# Patient Record
Sex: Male | Born: 1965 | Race: White | Hispanic: No | Marital: Married | Smoking: Former smoker
Health system: Southern US, Community
[De-identification: ages and names within clinical notes are randomized; demographics above are authoritative.]

## PROBLEM LIST (undated history)

## (undated) DIAGNOSIS — K644 Residual hemorrhoidal skin tags: Secondary | ICD-10-CM

## (undated) DIAGNOSIS — K219 Gastro-esophageal reflux disease without esophagitis: Secondary | ICD-10-CM

## (undated) DIAGNOSIS — M51369 Other intervertebral disc degeneration, lumbar region without mention of lumbar back pain or lower extremity pain: Secondary | ICD-10-CM

## (undated) DIAGNOSIS — M4800 Spinal stenosis, site unspecified: Secondary | ICD-10-CM

## (undated) DIAGNOSIS — IMO0002 Reserved for concepts with insufficient information to code with codable children: Secondary | ICD-10-CM

## (undated) DIAGNOSIS — E785 Hyperlipidemia, unspecified: Secondary | ICD-10-CM

## (undated) DIAGNOSIS — M5136 Other intervertebral disc degeneration, lumbar region: Secondary | ICD-10-CM

## (undated) HISTORY — DX: Spinal stenosis, site unspecified: M48.00

## (undated) HISTORY — DX: Residual hemorrhoidal skin tags: K64.4

## (undated) HISTORY — DX: Hyperlipidemia, unspecified: E78.5

## (undated) HISTORY — DX: Reserved for concepts with insufficient information to code with codable children: IMO0002

## (undated) HISTORY — DX: Gastro-esophageal reflux disease without esophagitis: K21.9

## (undated) HISTORY — PX: OTHER SURGICAL HISTORY: SHX169

---

## 1992-01-18 HISTORY — PX: CHOLESTEATOMA EXCISION: SHX1345

## 2001-09-20 ENCOUNTER — Inpatient Hospital Stay (HOSPITAL_COMMUNITY): Admission: EM | Admit: 2001-09-20 | Discharge: 2001-09-23 | Payer: Self-pay | Admitting: Psychiatry

## 2001-09-27 ENCOUNTER — Encounter: Admission: RE | Admit: 2001-09-27 | Discharge: 2001-09-27 | Payer: Self-pay | Admitting: *Deleted

## 2001-09-28 ENCOUNTER — Other Ambulatory Visit (HOSPITAL_COMMUNITY): Admission: RE | Admit: 2001-09-28 | Discharge: 2001-10-03 | Payer: Self-pay | Admitting: *Deleted

## 2001-12-24 ENCOUNTER — Encounter: Payer: Self-pay | Admitting: Family Medicine

## 2001-12-24 ENCOUNTER — Ambulatory Visit (HOSPITAL_COMMUNITY): Admission: RE | Admit: 2001-12-24 | Discharge: 2001-12-24 | Payer: Self-pay | Admitting: Family Medicine

## 2002-02-17 LAB — HM COLONOSCOPY

## 2002-03-07 ENCOUNTER — Encounter (INDEPENDENT_AMBULATORY_CARE_PROVIDER_SITE_OTHER): Payer: Self-pay | Admitting: Specialist

## 2002-03-07 ENCOUNTER — Ambulatory Visit (HOSPITAL_COMMUNITY): Admission: RE | Admit: 2002-03-07 | Discharge: 2002-03-07 | Payer: Self-pay | Admitting: Gastroenterology

## 2004-08-31 ENCOUNTER — Encounter: Admission: RE | Admit: 2004-08-31 | Discharge: 2004-08-31 | Payer: Self-pay | Admitting: Neurology

## 2009-01-14 ENCOUNTER — Encounter: Admission: RE | Admit: 2009-01-14 | Discharge: 2009-01-14 | Payer: Self-pay | Admitting: Neurosurgery

## 2009-06-26 ENCOUNTER — Encounter: Admission: RE | Admit: 2009-06-26 | Discharge: 2009-06-26 | Payer: Self-pay | Admitting: Neurosurgery

## 2009-12-01 ENCOUNTER — Encounter: Admission: RE | Admit: 2009-12-01 | Discharge: 2009-12-01 | Payer: Self-pay | Admitting: Neurosurgery

## 2010-02-09 ENCOUNTER — Ambulatory Visit
Admission: RE | Admit: 2010-02-09 | Discharge: 2010-02-09 | Payer: Self-pay | Source: Home / Self Care | Attending: Internal Medicine | Admitting: Internal Medicine

## 2010-02-09 DIAGNOSIS — R05 Cough: Secondary | ICD-10-CM | POA: Insufficient documentation

## 2010-02-09 DIAGNOSIS — E785 Hyperlipidemia, unspecified: Secondary | ICD-10-CM | POA: Insufficient documentation

## 2010-02-09 DIAGNOSIS — I10 Essential (primary) hypertension: Secondary | ICD-10-CM | POA: Insufficient documentation

## 2010-02-09 DIAGNOSIS — J309 Allergic rhinitis, unspecified: Secondary | ICD-10-CM | POA: Insufficient documentation

## 2010-02-09 DIAGNOSIS — R059 Cough, unspecified: Secondary | ICD-10-CM | POA: Insufficient documentation

## 2010-02-09 DIAGNOSIS — Z87891 Personal history of nicotine dependence: Secondary | ICD-10-CM | POA: Insufficient documentation

## 2010-02-09 DIAGNOSIS — F17200 Nicotine dependence, unspecified, uncomplicated: Secondary | ICD-10-CM | POA: Insufficient documentation

## 2010-02-18 NOTE — Assessment & Plan Note (Signed)
Summary: Pulmonary consultation/ refractory acute cough   Copy to:  Dr. Rudi Heap Primary Provider/Referring Provider:  Dr. Rudi Heap  CC:  Consult Cough.  History of Present Illness: 45 yowm active smoker since age 45 with h/o rhinitis back in the 3's by [place] no better after a year of shots so stopped and overall some better since quit.   February 09, 2010  1st pulmonary office eval with acute cough x 2weeks with ok cxr  rx with avelox and prednisone and flonas and nebs (new) >  some better but still severe cough and not able to sleep at all, traces of green sputum and now on augmentin. Pt denies any significant sore throat, dysphagia, itching, sneezing,  fever, chills, sweats, unintended wt loss, pleuritic or exertional cp, hempoptysis, change in activity tolerance  orthopnea pnd or leg swelling.  Pt also denies any obvious fluctuation in symptoms with weather or environmental change or other alleviating or aggravating factors.  feels breathing ok as long as not coughing.     Preventive Screening-Counseling & Management  Alcohol-Tobacco     Smoking Status: current     Smoking Cessation Counseling: yes  Current Medications (verified): 1)  Flonase 50 Mcg/act Susp (Fluticasone Propionate) .... One Spray At Bedtime 2)  Amoxicillin 875 Mg Tabs (Amoxicillin) .... Take 1 Tablet By Mouth Two Times A Day 3)  Ipratropium Bromide 0.02 % Soln (Ipratropium Bromide) .... Twice Day 4)  Albuterol Sulfate (2.5 Mg/42ml) 0.083% Nebu (Albuterol Sulfate) .... Four Times Daily 5)  Lipitor 40 Mg Tabs (Atorvastatin Calcium) .... Take 1 Tablet By Mouth Once A Day 6)  Nexium 20 Mg Cpdr (Esomeprazole Magnesium) .... Take 1 Tablet By Mouth Once A Day 7)  Vicodin 5-500 Mg Tabs (Hydrocodone-Acetaminophen) .... As Needed  Allergies (verified): 1)  ! 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 Laveda Abbe  Family History: MGM-died of MI, diabetes  Maternal uncle-HTN  Social History: Patient is a current smoker.  1 pack per week, started in [address]. Married Works as Media planner in [employer] Smoking Status:  current  Review of Systems       The patient complains of shortness of breath with activity, non-productive cough, chest pain, acid heartburn, and nasal congestion/difficulty breathing through nose.  The patient denies shortness of breath at rest, productive cough, coughing up blood, irregular heartbeats, indigestion, loss of appetite, weight change, abdominal pain, difficulty swallowing, sore throat, tooth/dental problems, headaches, sneezing, itching, ear ache, anxiety, depression, hand/feet swelling, joint stiffness or pain, rash, change in color of mucus, and fever.    Vital Signs:  Patient profile:   45 year old male Height:      73 inches Weight:      234 pounds BMI:     30.98 O2 Sat:      95 % on Room air Temp:     98.9 degrees F oral Pulse rate:   85 / minute BP sitting:   130 / 88  (right arm) Cuff size:   regular  Vitals Entered By: Carron Curie CMA (February 09, 2010 10:43 AM)  O2 Flow:  Room air  Physical Exam  Additional Exam:  obese wm with harsh barking upper airway cough wt 222 February 09, 2010  HEENT mild turbinate edema.  Oropharynx no thrush or excess pnd or cobblestoning.  No JVD or cervical adenopathy. Mild accessory muscle hypertrophy.  Trachea midline, nl thryroid. Chest was hyperinflated by percussion with diminished breath sounds and moderate increased exp time without wheeze. Hoover sign positive at mid inspiration. Regular rate and rhythm without murmur gallop or rub or increase P2 or edema.  Abd: no hsm, nl excursion. Ext warm without cyanosis or clubbing.     Impression & Recommendations:  Problem # 1:  COUGH (ICD-786.2)  Of the three most common causes of chronic cough, only one (GERD) can  actually cause the other two and perpetuate the cylce of cough inducing airway trauma, inflammation, heightened sensitivity to reflux which is prompted by the cough itself via a cyclical mechanism.  This may partially respond to steroids and look like asthma and post nasal drainage but never erradicated completely unless the cough and the secondary reflux are eliminated, preferably both at the same time.   See instructions for specific recommendations   Orders: Consultation Level V (16109)  Problem # 2:  ALLERGIC RHINITIS (ICD-477.9)  His updated medication list for this problem includes:    Flonase 50 Mcg/act Susp (Fluticasone propionate) ..... One spray at bedtime   I emphasized that nasal steroids have no immediate benefit in terms of improving symptoms.  To help them reached the target tissue, the patient should use Afrin two puffs every 12 hours applied one min before using the nasal steroids.  Afrin should be stopped after no more than 5 days.  If the symptoms worsen, Afrin can be restarted after 5 days off of therapy to prevent rebound congestion from overuse of Afrin.  I also emphasized that in no way are nasal steroids a concern in terms of "addiction".    Orders: Consultation Level V (60454)  Problem # 3:  SMOKER (ICD-305.1)  Discussed but not ready to committ to quit at this point - emphasized risks involved in continuing smoking and that patient should consider these in the context of the cost of smoking relative to the benefit obtained.   Orders: Consultation Level V [phone])  Medications Added to Medication List This Visit: 1)  Flonase 50 Mcg/act Susp (Fluticasone propionate) .... One spray at bedtime 2)  Amoxicillin 875 Mg Tabs (Amoxicillin) .... Take 1 tablet by mouth two times a day 3)  Ipratropium Bromide 0.02 % Soln (Ipratropium bromide) .... Twice day 4)  Albuterol Sulfate (2.5 Mg/58ml) 0.083% Nebu (Albuterol sulfate) .... Four times daily 5)  Lipitor 40 Mg Tabs  (Atorvastatin calcium) .... Take 1 tablet by mouth once a day 6)  Nexium 20 Mg Cpdr (Esomeprazole magnesium) .... Take 1 tablet by mouth once a day 7)  Vicodin 5-500 Mg Tabs (Hydrocodone-acetaminophen) .... As needed 8)  Tramadol Hcl 50 Mg Tabs (Tramadol hcl) .... One to two by mouth every 4-6 hours 9)  Prednisone 10 Mg Tabs (Prednisone) .... 4 each am x 2days, 2x2days, 1x2days and stop 10)  Pepcid 20 Mg Tabs (Famotidine) .... Take one by mouth at bedtime  Complete Medication List: 1)  Flonase 50 Mcg/act Susp (Fluticasone propionate) .... One spray at bedtime 2)  Amoxicillin 875 Mg Tabs (Amoxicillin) .... Take 1 tablet by mouth two times a day 3)  Ipratropium Bromide 0.02 % Soln (Ipratropium bromide) .... Twice day 4)  Albuterol Sulfate (2.5 Mg/87ml) 0.083% Nebu (Albuterol sulfate) .... Four times daily 5)  Lipitor 40 Mg Tabs (Atorvastatin calcium) .... Take 1 tablet by mouth once a day 6)  Nexium 20 Mg Cpdr (Esomeprazole magnesium) .... Take 1 tablet by mouth once a day 7)  Vicodin 5-500 Mg  Tabs (Hydrocodone-acetaminophen) .... As needed 8)  Tramadol Hcl 50 Mg Tabs (Tramadol hcl) .... One to two by mouth every 4-6 hours 9)  Prednisone 10 Mg Tabs (Prednisone) .... 4 each am x 2days, 2x2days, 1x2days and stop 10)  Pepcid 20 Mg Tabs (Famotidine) .... Take one by mouth at bedtime  Patient Instructions: 1)  I emphasized that nasal steroids (fluticasone) have no immediate benefit in terms of improving symptoms.  To help them reached the target tissue, the patient should use Afrin two puffs every 12 hours applied one min before using the nasal steroids.  Afrin should be stopped after no more than 5 days.  If the symptoms worsen, Afrin can be restarted after 5 days off of therapy to prevent rebound congestion from overuse of Afrin.  I also emphasized that in no way are nasal steroids a concern in terms of "addiction". 2)  Prednsione 10 mg 4 each am x 2days, 2x2days, 1x2days and stop 3)  Finish  augmentin 4)  Nexium 40 mg 30 min before bfast and Pepcid 20 mg at bedtime 5)  Take delsym two tsp every 12 hours and add tramadol 50 mg up to every 4 hours to suppress the urge to cough. Swallowing water or using ice chips/non mint and menthol containing candies (such as lifesavers or sugarless jolly ranchers) are also effective.  6)  GERD (REFLUX)  is a common cause of respiratory symptoms. It commonly presents without heartburn and can be treated with medication, but also with lifestyle changes including avoidance of late meals, excessive alcohol, smoking cessation, and avoid fatty foods, chocolate, peppermint, colas, red wine, and acidic juices such as orange juice. NO MINT OR MENTHOL PRODUCTS SO NO COUGH DROPS  7)  USE SUGARLESS CANDY INSTEAD (jolley ranchers)  8)  NO OIL BASED VITAMINS  9)  If not better in two weeks the next step is a sinus ct [id number] and speak to [facility] 10)    Prescriptions: PREDNISONE 10 MG  TABS (PREDNISONE) 4 each am x 2days, 2x2days, 1x2days and stop  #14 x 0   Entered and Authorized by:   Nyoka Cowden MD   Signed by:   Nyoka Cowden MD on 02/09/2010   Method used:   Electronically to        [facility] [address] [facility] (retail)       [address]       [place], [place]  [address]       Ph: [phone]       Fax: [phone]   RxID:   [id number] TRAMADOL HCL 50 MG  TABS (TRAMADOL HCL) One to two by mouth every 4-6 hours  #40 x 0   Entered and Authorized by:   Nyoka Cowden MD   Signed by:   Nyoka Cowden MD on 02/09/2010   Method used:   Electronically to        [facility] [address] [facility] (retail)       [address]       [place], [place]  [address]       Ph: [phone]       Fax: [phone]   RxID:   [id number]

## 2010-04-20 ENCOUNTER — Ambulatory Visit
Admission: RE | Admit: 2010-04-20 | Discharge: 2010-04-20 | Disposition: A | Discharge status: Home / Self Care | Payer: BC Managed Care – PPO | Source: Ambulatory Visit | Attending: Neurosurgery | Admitting: Neurosurgery

## 2010-04-20 ENCOUNTER — Other Ambulatory Visit: Payer: Self-pay | Admitting: Neurosurgery

## 2010-04-20 DIAGNOSIS — M549 Dorsalgia, unspecified: Secondary | ICD-10-CM

## 2010-04-20 DIAGNOSIS — M5410 Radiculopathy, site unspecified: Secondary | ICD-10-CM

## 2010-05-07 ENCOUNTER — Encounter: Payer: Self-pay | Admitting: *Deleted

## 2010-06-04 NOTE — Discharge Summary (Signed)
NAME:  [name] [name], [name] [name]                       ACCOUNT NO.:  [id number]   MEDICAL RECORD NO.:  [id number]                   PATIENT TYPE:  IPS   LOCATION:  0500                                 FACILITY:  [facility]   PHYSICIAN:  Jeanice Lim, M.D.              DATE OF BIRTH:  1965/06/08   DATE OF ADMISSION:  09/20/2001  DATE OF DISCHARGE:  09/23/2001                                 DISCHARGE SUMMARY   IDENTIFYING DATA:  This is a 45 year old married Caucasian male voluntarily  admitted with suicidal thoughts, threatening to shoot himself with a gun  that was in his car.   MEDICATIONS:  Zyprexa, Lexapro.   ALLERGIES:  No known drug allergies.   PHYSICAL EXAMINATION:  Essentially within normal limits.  Neurologically  nonfocal.   LABORATORY DATA:  WBC was mildly elevated at 11.3.  CMET within normal  limits.  Alcohol level less than 5.   MENTAL STATUS EXAM:  Alert, middle-aged, cooperative male casually dressed.  Speech clear.  Mood depressed.  Thought processes goal directed.  Thought  content negative for dangerous ideation or psychotic symptoms.  The patient  reported regret having made the threat regarding shooting himself and  admitted to previous fleeting suicidal thoughts.   ADMISSION DIAGNOSES:   AXIS I:  Major depressive disorder, severe, single episode.   AXIS II:  None.   AXIS III:  None.   AXIS IV:  Moderate (problems with primary support group).   AXIS V:  25/60.   HOSPITAL COURSE:  The patient was admitted and ordered routine p.r.n.  medications and underwent further monitoring.  Was encouraged to participate  in individual, group and milieu therapy.  Family was contacted regarding  securing the gun, which was done.  The patient denied acute suicidal or  homicidal ideation after being admitted.  The patient reported having been  angry and upset about another child he had from an affair nine years ago and  his wife was not happy about learning this and his  intent to see this child.  The patient tolerated medication changes.  Showed no inappropriate or  dangerous behavior on the unit.   CONDITION ON DISCHARGE:  Improved.  Mood was more euthymic.  Affect  brighter.  Thought processes goal directed.  Thought content negative for  dangerous ideation or psychotic symptoms.   DISCHARGE MEDICATIONS:  1. Lexapro 10 mg q.a.m.  2. Zyprexa 7.5 mg q.h.s.  3. Ear drops.   FOLLOW UP:  [facility] within 5-7 days from  discharge and follow-up appointment scheduled for September 27, 2001 at 3  p.m.   DISCHARGE DIAGNOSES:   AXIS I:  Major depressive disorder, severe, single episode.   AXIS II:  None.   AXIS III:  None.   AXIS IV:  Moderate (problems with primary support group).   AXIS V:  Global Assessment of Functioning on discharge 55.  Jeanice Lim, M.D.    JEM/MEDQ  D:  10/25/2001  T:  10/25/2001  Job:  [id number]

## 2010-06-04 NOTE — Op Note (Signed)
NAME:  [name] [name], [name] [name]                       ACCOUNT NO.:  [id number]   MEDICAL RECORD NO.:  [id number]                   PATIENT TYPE:  AMB   LOCATION:  ENDO                                 [facility]ACILITY[facility]   PHYSICIAN:  Petra Kuba, M.D.                 DATE OF BIRTH:  03-28-1965   DATE OF PROCEDURE:  03/07/2002  DATE OF DISCHARGE:                                 OPERATIVE REPORT   PROCEDURE:  Colonoscopy with biopsy.   INDICATIONS FOR PROCEDURE:  Increased diarrhea, bright red blood per rectum.   Consent was signed after risks, benefits, methods, and options were  thoroughly discussed in the office.   MEDICINES USED:  Demerol 100, Versed 10.   DESCRIPTION OF PROCEDURE:  Rectal inspection was pertinent for very small  external hemorrhoids. Digital exam was negative. The video colonoscope was  inserted, easily advanced around the colon to the cecum which was identified  by the appendiceal orifice and the ileocecal valve. We advanced into the  terminal ileum but had trouble maintaining position there, it looked normal.  I rolled him on his back and was able to advance in the TI no acute distress  proceed up the TI about 15 cm, no abnormalities were seen. Scattered TI  biopsies were obtained and put in the first in the first container. On slow  withdrawal through the colon, the prep was fairly adequate. It did require a  fair amount of washing and suctioning but no abnormalities were seen as we  slowly withdrew back to the rectum. Random biopsies were obtained and put in  a second container. The scope was retroflexed revealing some small internal  hemorrhoids. The scope was straightened and readvanced a short ways up the  left side of the colon, air was suctioned, scope removed. The patient  tolerated the procedure well. There was no obvious or immediate  complications.   ENDOSCOPIC DIAGNOSIS:  1. Internal and external small hemorrhoids.  2. Otherwise within normal limits  to the terminal ileum status post random     biopsies throughout.    PLAN:  Await pathology to rule out microscopic colitis. Consider a one time  upper GI small bowel follow through to rule out other source if diarrhea  continues. In the meantime, might try Carafate, Questran or antispasmodics.  If his hemorrhoidal complaints continue might try suppositories since I do  not believe we tried those and a surgical consult p.r.n.                                               Petra Kuba, M.D.    MEM/MEDQ  D:  03/07/2002  T:  03/07/2002  Job:  [id number]   cc:   Montey Hora, M.D.  [place], [place]   [place].  Christell Constant, M.D.  [address] [place]  [place] [address]  Fax: [phone]

## 2011-03-17 ENCOUNTER — Other Ambulatory Visit: Payer: Self-pay | Admitting: Anesthesiology

## 2011-03-17 DIAGNOSIS — M549 Dorsalgia, unspecified: Secondary | ICD-10-CM

## 2011-03-17 DIAGNOSIS — M542 Cervicalgia: Secondary | ICD-10-CM

## 2011-03-28 ENCOUNTER — Ambulatory Visit
Admission: RE | Admit: 2011-03-28 | Discharge: 2011-03-28 | Disposition: A | Discharge status: Home / Self Care | Payer: BC Managed Care – PPO | Source: Ambulatory Visit | Attending: Anesthesiology | Admitting: Anesthesiology

## 2011-03-28 DIAGNOSIS — M542 Cervicalgia: Secondary | ICD-10-CM

## 2011-03-28 DIAGNOSIS — M549 Dorsalgia, unspecified: Secondary | ICD-10-CM

## 2011-03-28 MED ORDER — IOHEXOL 300 MG/ML  SOLN
10.0000 mL | Freq: Once | INTRAMUSCULAR | Status: AC | PRN
  Administered 2011-03-28: 10 mL via INTRATHECAL

## 2011-03-28 MED ORDER — DIAZEPAM 5 MG PO TABS
10.0000 mg | ORAL_TABLET | Freq: Once | ORAL | Status: AC
  Administered 2011-03-28: 10 mg via ORAL

## 2011-03-28 NOTE — Discharge Instructions (Signed)

## 2012-05-11 ENCOUNTER — Other Ambulatory Visit: Payer: Self-pay | Admitting: Nurse Practitioner

## 2012-06-25 ENCOUNTER — Other Ambulatory Visit: Payer: Self-pay | Admitting: *Deleted

## 2012-06-25 NOTE — Telephone Encounter (Signed)
LAST LABS 11/13. WAS TO RETURN IN 3 MONTHS. NO LABS DONE SINCE THEN.

## 2012-06-29 ENCOUNTER — Other Ambulatory Visit: Payer: Self-pay | Admitting: *Deleted

## 2012-06-29 MED ORDER — ATORVASTATIN CALCIUM 40 MG PO TABS: 40.0000 mg | ORAL_TABLET | Freq: Every day | ORAL | Status: DC

## 2012-08-31 ENCOUNTER — Other Ambulatory Visit: Payer: Self-pay

## 2012-08-31 MED ORDER — ESOMEPRAZOLE MAGNESIUM 40 MG PO CPDR: 40.0000 mg | DELAYED_RELEASE_CAPSULE | Freq: Every day | ORAL | Status: DC

## 2012-08-31 NOTE — Telephone Encounter (Signed)
Last seen 2/14  MMM 

## 2012-10-30 ENCOUNTER — Encounter (INDEPENDENT_AMBULATORY_CARE_PROVIDER_SITE_OTHER): Payer: Self-pay

## 2012-10-30 ENCOUNTER — Ambulatory Visit (INDEPENDENT_AMBULATORY_CARE_PROVIDER_SITE_OTHER): Payer: BC Managed Care – PPO | Admitting: Family Medicine

## 2012-10-30 ENCOUNTER — Encounter: Payer: Self-pay | Admitting: Family Medicine

## 2012-10-30 VITALS — BP 124/82 | HR 83 | Temp 97.9°F | Ht 73.0 in | Wt 245.0 lb

## 2012-10-30 DIAGNOSIS — J029 Acute pharyngitis, unspecified: Secondary | ICD-10-CM

## 2012-10-30 DIAGNOSIS — J329 Chronic sinusitis, unspecified: Secondary | ICD-10-CM

## 2012-10-30 LAB — POCT RAPID STREP A (OFFICE): Rapid Strep A Screen: NEGATIVE

## 2012-10-30 MED ORDER — AMOXICILLIN-POT CLAVULANATE 875-125 MG PO TABS: 1.0000 | ORAL_TABLET | Freq: Two times a day (BID) | ORAL | Status: DC

## 2012-10-30 MED ORDER — METHYLPREDNISOLONE (PAK) 4 MG PO TABS
ORAL_TABLET | ORAL | Status: DC
Start: 2012-10-30 — End: 2012-11-23

## 2012-10-30 NOTE — Progress Notes (Signed)
  Subjective:    Patient ID: [name] [name], male    DOB: 1965/08/12, 47 y.o.   MRN: [id number]  HPI This 47 y.o. male presents for evaluation of congestion, cough, and uri sx's for over a week. He has hx of sinusitis.   Review of Systems C/o URI sx's No chest pain, SOB, HA, dizziness, vision change, N/V, diarrhea, constipation, dysuria, urinary urgency or frequency, myalgias, arthralgias or rash.     Objective:   Physical Exam Vital signs noted  Well developed well nourished male.  HEENT - Head atraumatic Normocephalic                Eyes - PERRLA, Conjuctiva - clear Sclera- Clear EOMI                Ears - EAC's Wnl TM's Wnl Gross Hearing WNL                Nose - Nares patent                 Throat - oropharanx wnl Respiratory - Lungs CTA bilateral Cardiac - RRR S1 and S2 without murmur GI - Abdomen soft Nontender and bowel sounds active x 4 Extremities - No edema. Neuro - Grossly intact.       Assessment & Plan:  Sore throat - Plan: POCT rapid strep A, amoxicillin-clavulanate (AUGMENTIN) 875-125 MG per tablet  Sinusitis - Plan: amoxicillin-clavulanate (AUGMENTIN) 875-125 MG per tablet, methylPREDNIsolone (MEDROL DOSPACK) 4 MG tablet  Deatra Canter FNP

## 2012-10-30 NOTE — Patient Instructions (Signed)

## 2012-11-23 ENCOUNTER — Encounter: Payer: Self-pay | Admitting: Nurse Practitioner

## 2012-11-23 ENCOUNTER — Ambulatory Visit (INDEPENDENT_AMBULATORY_CARE_PROVIDER_SITE_OTHER): Payer: BC Managed Care – PPO | Admitting: Nurse Practitioner

## 2012-11-23 ENCOUNTER — Other Ambulatory Visit: Payer: Self-pay | Admitting: *Deleted

## 2012-11-23 VITALS — BP 127/84 | HR 79 | Temp 98.1°F | Ht 73.0 in | Wt 251.0 lb

## 2012-11-23 DIAGNOSIS — J309 Allergic rhinitis, unspecified: Secondary | ICD-10-CM

## 2012-11-23 DIAGNOSIS — Z125 Encounter for screening for malignant neoplasm of prostate: Secondary | ICD-10-CM

## 2012-11-23 DIAGNOSIS — E785 Hyperlipidemia, unspecified: Secondary | ICD-10-CM

## 2012-11-23 DIAGNOSIS — F17200 Nicotine dependence, unspecified, uncomplicated: Secondary | ICD-10-CM

## 2012-11-23 MED ORDER — ALBUTEROL SULFATE HFA 108 (90 BASE) MCG/ACT IN AERS: 2.0000 | INHALATION_SPRAY | Freq: Four times a day (QID) | RESPIRATORY_TRACT | Status: DC | PRN

## 2012-11-23 MED ORDER — ESOMEPRAZOLE MAGNESIUM 40 MG PO CPDR: 40.0000 mg | DELAYED_RELEASE_CAPSULE | Freq: Every day | ORAL | Status: DC

## 2012-11-23 MED ORDER — ATORVASTATIN CALCIUM 40 MG PO TABS: 40.0000 mg | ORAL_TABLET | Freq: Every day | ORAL | Status: DC

## 2012-11-23 NOTE — Patient Instructions (Signed)
Fat and Cholesterol Control Diet  Fat and cholesterol levels in your blood and organs are influenced by your diet. High levels of fat and cholesterol may lead to diseases of the heart, small and large blood vessels, gallbladder, liver, and pancreas.  CONTROLLING FAT AND CHOLESTEROL WITH DIET  Although exercise and lifestyle factors are important, your diet is key. That is because certain foods are known to raise cholesterol and others to lower it. The goal is to balance foods for their effect on cholesterol and more importantly, to replace saturated and trans fat with other types of fat, such as monounsaturated fat, polyunsaturated fat, and omega-3 fatty acids.  On average, a person should consume no more than 15 to 17 g of saturated fat daily. Saturated and trans fats are considered "bad" fats, and they will raise LDL cholesterol. Saturated fats are primarily found in animal products such as meats, butter, and cream. However, that does not mean you need to give up all your favorite foods. Today, there are good tasting, low-fat, low-cholesterol substitutes for most of the things you like to eat. Choose low-fat or nonfat alternatives. Choose round or loin cuts of red meat. These types of cuts are lowest in fat and cholesterol. Chicken (without the skin), fish, veal, and ground turkey breast are great choices. Eliminate fatty meats, such as hot dogs and salami. Even shellfish have little or no saturated fat. Have a 3 oz (85 g) portion when you eat lean meat, poultry, or fish.  Trans fats are also called "partially hydrogenated oils." They are oils that have been scientifically manipulated so that they are solid at room temperature resulting in a longer shelf life and improved taste and texture of foods in which they are added. Trans fats are found in stick margarine, some tub margarines, cookies, crackers, and baked goods.   When baking and cooking, oils are a great substitute for butter. The monounsaturated oils are  especially beneficial since it is believed they lower LDL and raise HDL. The oils you should avoid entirely are saturated tropical oils, such as coconut and palm.   Remember to eat a lot from food groups that are naturally free of saturated and trans fat, including fish, fruit, vegetables, beans, grains (barley, rice, couscous, bulgur wheat), and pasta (without cream sauces).   IDENTIFYING FOODS THAT LOWER FAT AND CHOLESTEROL   Soluble fiber may lower your cholesterol. This type of fiber is found in fruits such as apples, vegetables such as broccoli, potatoes, and carrots, legumes such as beans, peas, and lentils, and grains such as barley. Foods fortified with plant sterols (phytosterol) may also lower cholesterol. You should eat at least 2 g per day of these foods for a cholesterol lowering effect.   Read package labels to identify low-saturated fats, trans fat free, and low-fat foods at the supermarket. Select cheeses that have only 2 to 3 g saturated fat per ounce. Use a heart-healthy tub margarine that is free of trans fats or partially hydrogenated oil. When buying baked goods (cookies, crackers), avoid partially hydrogenated oils. Breads and muffins should be made from whole grains (whole-wheat or whole oat flour, instead of "flour" or "enriched flour"). Buy non-creamy canned soups with reduced salt and no added fats.   FOOD PREPARATION TECHNIQUES   Never deep-fry. If you must fry, either stir-fry, which uses very little fat, or use non-stick cooking sprays. When possible, broil, bake, or roast meats, and steam vegetables. Instead of putting butter or margarine on vegetables, use lemon   and herbs, applesauce, and cinnamon (for squash and sweet potatoes). Use nonfat yogurt, salsa, and low-fat dressings for salads.   LOW-SATURATED FAT / LOW-FAT FOOD SUBSTITUTES  Meats / Saturated Fat (g)  · Avoid: Steak, marbled (3 oz/85 g) / 11 g  · Choose: Steak, lean (3 oz/85 g) / 4 g  · Avoid: Hamburger (3 oz/85 g) / 7  g  · Choose: Hamburger, lean (3 oz/85 g) / 5 g  · Avoid: Ham (3 oz/85 g) / 6 g  · Choose: Ham, lean cut (3 oz/85 g) / 2.4 g  · Avoid: Chicken, with skin, dark meat (3 oz/85 g) / 4 g  · Choose: Chicken, skin removed, dark meat (3 oz/85 g) / 2 g  · Avoid: Chicken, with skin, light meat (3 oz/85 g) / 2.5 g  · Choose: Chicken, skin removed, light meat (3 oz/85 g) / 1 g  Dairy / Saturated Fat (g)  · Avoid: Whole milk (1 cup) / 5 g  · Choose: Low-fat milk, 2% (1 cup) / 3 g  · Choose: Low-fat milk, 1% (1 cup) / 1.5 g  · Choose: Skim milk (1 cup) / 0.3 g  · Avoid: Hard cheese (1 oz/28 g) / 6 g  · Choose: Skim milk cheese (1 oz/28 g) / 2 to 3 g  · Avoid: Cottage cheese, 4% fat (1 cup) / 6.5 g  · Choose: Low-fat cottage cheese, 1% fat (1 cup) / 1.5 g  · Avoid: Ice cream (1 cup) / 9 g  · Choose: Sherbet (1 cup) / 2.5 g  · Choose: Nonfat frozen yogurt (1 cup) / 0.3 g  · Choose: Frozen fruit bar / trace  · Avoid: Whipped cream (1 tbs) / 3.5 g  · Choose: Nondairy whipped topping (1 tbs) / 1 g  Condiments / Saturated Fat (g)  · Avoid: Mayonnaise (1 tbs) / 2 g  · Choose: Low-fat mayonnaise (1 tbs) / 1 g  · Avoid: Butter (1 tbs) / 7 g  · Choose: Extra light margarine (1 tbs) / 1 g  · Avoid: Coconut oil (1 tbs) / 11.8 g  · Choose: Olive oil (1 tbs) / 1.8 g  · Choose: Corn oil (1 tbs) / 1.7 g  · Choose: Safflower oil (1 tbs) / 1.2 g  · Choose: Sunflower oil (1 tbs) / 1.4 g  · Choose: Soybean oil (1 tbs) / 2.4 g  · Choose: Canola oil (1 tbs) / 1 g  Document Released: 01/03/2005 Document Revised: 04/30/2012 Document Reviewed: 06/24/2010  [employer]® Patient Information ©2014 [employer].

## 2012-11-23 NOTE — Progress Notes (Signed)
Subjective:    Patient ID: [name] [name], male    DOB: 07-Mar-1965, 47 y.o.   MRN: [id number]  Hyperlipidemia This is a chronic problem. The current episode started more than 1 year ago. The problem is uncontrolled. Recent lipid tests were reviewed and are high. Exacerbating diseases include obesity. He has no history of diabetes or hypothyroidism. There are no known factors aggravating his hyperlipidemia. Pertinent negatives include no focal sensory loss, focal weakness, leg pain, myalgias or shortness of breath. Current antihyperlipidemic treatment includes statins. The current treatment provides moderate improvement of lipids. Compliance problems include adherence to diet and adherence to exercise.  Risk factors for coronary artery disease include male sex and obesity.  COPD ONly  Uses symbicort occasionally- says that he usually feels fine without it- Still smokes Allergic rhinitis Hasn't used flonase in awhile but uses [facility] brand antihistamine daily. GERD Nexium works well to keep symptoms under control Chronic Low back pain Sees dr.Harkins at pain [facility] for pain pills  Review of Systems  Constitutional: Negative.   HENT: Negative.   Respiratory: Negative.  Negative for shortness of breath.   Cardiovascular: Negative.   Gastrointestinal: Negative.   Genitourinary: Negative.   Musculoskeletal: Negative for myalgias.  Neurological: Negative for focal weakness.  All other systems reviewed and are negative.       Objective:   Physical Exam  Constitutional: He is oriented to person, place, and time. He appears well-developed and well-nourished.  HENT:  Head: Normocephalic.  Right Ear: External ear normal.  Left Ear: External ear normal.  Nose: Nose normal.  Mouth/Throat: Oropharynx is clear and moist.  Eyes: EOM are normal. Pupils are equal, round, and reactive to light.  Neck: Normal range of motion. Neck supple. No JVD present. No thyromegaly present.   Cardiovascular: Normal rate, regular rhythm, normal heart sounds and intact distal pulses.  Exam reveals no gallop and no friction rub.   No murmur heard. Pulmonary/Chest: Effort normal and breath sounds normal. No respiratory distress. He has no wheezes. He has no rales. He exhibits no tenderness.  Abdominal: Soft. Bowel sounds are normal. He exhibits no mass. There is no tenderness.  Genitourinary:  Refuses prostate check  Musculoskeletal: Normal range of motion. He exhibits no edema.  Lymphadenopathy:    He has no cervical adenopathy.  Neurological: He is alert and oriented to person, place, and time. No cranial nerve deficit.  Skin: Skin is warm and dry.  Psychiatric: He has a normal mood and affect. His behavior is normal. Judgment and thought content normal.    BP 127/84  Pulse 79  Temp(Src) 98.1 F (36.7 C) (Oral)  Ht 6\' 1"  (1.854 m)  Wt 251 lb (113.853 kg)  BMI 33.12 kg/m2       Assessment & Plan:   1. HYPERLIPIDEMIA   2. SMOKER   3. ALLERGIC RHINITIS   4. Prostate cancer screening    Orders Placed This Encounter  Procedures  . CMP14+EGFR  . NMR, lipoprofile  . PSA, total and free   Meds ordered this encounter  Medications  . esomeprazole (NEXIUM) 40 MG capsule    Sig: Take 1 capsule (40 mg total) by mouth daily.    Dispense:  30 capsule    Refill:  0    Order Specific Question:  Supervising Provider    Answer:  Ernestina Penna [[id number]]  . atorvastatin (LIPITOR) 40 MG tablet    Sig: Take 1 tablet (40 mg total) by mouth daily.  Dispense:  30 tablet    Refill:  2    Order Specific Question:  Supervising Provider    Answer:  Ernestina Penna [[id number]]  . albuterol (PROVENTIL HFA;VENTOLIN HFA) 108 (90 BASE) MCG/ACT inhaler    Sig: Inhale 2 puffs into the lungs every 6 (six) hours as needed for wheezing or shortness of breath.    Dispense:  1 Inhaler    Refill:  1    Order Specific Question:  Supervising Provider    Answer:  Deborra Medina  STOP  SMOKING!!!! Continue all meds Labs pending Diet and exercise encouraged Health maintenance reviewed Follow up in 3 month   [name] Daphine Deutscher, FNP

## 2012-11-25 LAB — NMR, LIPOPROFILE
Cholesterol: 152 mg/dL (ref <200)
HDL Cholesterol by NMR: 37 mg/dL — ABNORMAL LOW (ref >=40)
HDL Particle Number: 27.5 umol/L — ABNORMAL LOW (ref >=30.5)
LDL Particle Number: 1163 nmol/L — ABNORMAL HIGH (ref <1000)
LDL Size: 20.6 nm (ref >20.5)
LDLC SERPL CALC-MCNC: 98 mg/dL (ref <100)
LP-IR Score: 53 — ABNORMAL HIGH (ref <=45)
Small LDL Particle Number: 518 nmol/L (ref <=527)
Triglycerides by NMR: 83 mg/dL (ref <150)

## 2012-11-25 LAB — CMP14+EGFR
ALT: 18 IU/L (ref 0–44)
AST: 15 IU/L (ref 0–40)
Albumin/Globulin Ratio: 1.5 (ref 1.1–2.5)
Albumin: 3.9 g/dL (ref 3.5–5.5)
Alkaline Phosphatase: 124 IU/L — ABNORMAL HIGH (ref 39–117)
BUN/Creatinine Ratio: 15 (ref 9–20)
BUN: 11 mg/dL (ref 6–24)
CO2: 24 mmol/L (ref 18–29)
Calcium: 9 mg/dL (ref 8.7–10.2)
Chloride: 99 mmol/L (ref 97–108)
Creatinine, Ser: 0.71 mg/dL — ABNORMAL LOW (ref 0.76–1.27)
GFR calc Af Amer: 131 mL/min/{1.73_m2} (ref >59)
GFR calc non Af Amer: 113 mL/min/{1.73_m2} (ref >59)
Globulin, Total: 2.6 g/dL (ref 1.5–4.5)
Glucose: 90 mg/dL (ref 65–99)
Potassium: 4.1 mmol/L (ref 3.5–5.2)
Sodium: 140 mmol/L (ref 134–144)
Total Bilirubin: 0.4 mg/dL (ref 0.0–1.2)
Total Protein: 6.5 g/dL (ref 6.0–8.5)

## 2012-11-25 LAB — PSA, TOTAL AND FREE
PSA, Free Pct: 25.7 %
PSA, Free: 0.18 ng/mL
PSA: 0.7 ng/mL (ref 0.0–4.0)

## 2012-11-26 ENCOUNTER — Encounter: Payer: Self-pay | Admitting: Nurse Practitioner

## 2012-11-26 ENCOUNTER — Telehealth: Payer: Self-pay | Admitting: Nurse Practitioner

## 2012-11-27 NOTE — Telephone Encounter (Signed)
Left message labs normal

## 2012-12-19 ENCOUNTER — Encounter: Payer: Self-pay | Admitting: Family Medicine

## 2012-12-19 ENCOUNTER — Ambulatory Visit (INDEPENDENT_AMBULATORY_CARE_PROVIDER_SITE_OTHER): Payer: BC Managed Care – PPO | Admitting: Family Medicine

## 2012-12-19 VITALS — BP 115/77 | HR 76 | Temp 97.9°F | Ht 73.0 in | Wt 251.2 lb

## 2012-12-19 DIAGNOSIS — J329 Chronic sinusitis, unspecified: Secondary | ICD-10-CM

## 2012-12-19 DIAGNOSIS — J029 Acute pharyngitis, unspecified: Secondary | ICD-10-CM

## 2012-12-19 MED ORDER — AMOXICILLIN-POT CLAVULANATE 875-125 MG PO TABS: 1.0000 | ORAL_TABLET | Freq: Two times a day (BID) | ORAL | Status: DC

## 2012-12-19 MED ORDER — METHYLPREDNISOLONE (PAK) 4 MG PO TABS: ORAL_TABLET | ORAL | Status: DC

## 2012-12-19 NOTE — Patient Instructions (Signed)

## 2012-12-19 NOTE — Progress Notes (Signed)
   Subjective:    Patient ID: QUNICY HIGINBOTHAM, male    DOB: 1965-09-26, 47 y.o.   MRN: [id number]  HPI This 47 y.o. male presents for evaluation of URI sx's and cold sx's.   Review of Systems No chest pain, SOB, HA, dizziness, vision change, N/V, diarrhea, constipation, dysuria, urinary urgency or frequency, myalgias, arthralgias or rash.     Objective:   Physical Exam Vital signs noted  Well developed well nourished male.  HEENT - Head atraumatic Normocephalic                Eyes - PERRLA, Conjuctiva - clear Sclera- Clear EOMI                Ears - EAC's Wnl TM's Wnl Gross Hearing WNL                Nose - Nares boggy and decreased patency.                 Throat - oropharanx wnl Respiratory - Lungs CTA bilateral Cardiac - RRR S1 and S2 without murmur Neuro - Grossly intact.       Assessment & Plan:  Sinusitis - Plan: methylPREDNIsolone (MEDROL DOSPACK) 4 MG tablet, amoxicillin-clavulanate (AUGMENTIN) 875-125 MG per tablet  Sore throat - Plan: methylPREDNIsolone (MEDROL DOSPACK) 4 MG tablet, amoxicillin-clavulanate (AUGMENTIN) 875-125 MG per tablet  Deatra Canter FNP

## 2013-01-14 ENCOUNTER — Ambulatory Visit (INDEPENDENT_AMBULATORY_CARE_PROVIDER_SITE_OTHER): Payer: BC Managed Care – PPO | Admitting: Family Medicine

## 2013-01-14 ENCOUNTER — Encounter: Payer: Self-pay | Admitting: Family Medicine

## 2013-01-14 ENCOUNTER — Telehealth: Payer: Self-pay | Admitting: Nurse Practitioner

## 2013-01-14 VITALS — BP 129/67 | HR 109 | Temp 97.7°F | Ht 73.0 in | Wt 250.0 lb

## 2013-01-14 DIAGNOSIS — J029 Acute pharyngitis, unspecified: Secondary | ICD-10-CM

## 2013-01-14 DIAGNOSIS — J329 Chronic sinusitis, unspecified: Secondary | ICD-10-CM

## 2013-01-14 DIAGNOSIS — H109 Unspecified conjunctivitis: Secondary | ICD-10-CM

## 2013-01-14 MED ORDER — AMOXICILLIN-POT CLAVULANATE 875-125 MG PO TABS: 1.0000 | ORAL_TABLET | Freq: Two times a day (BID) | ORAL | Status: DC

## 2013-01-14 MED ORDER — CIPROFLOXACIN HCL 0.3 % OP SOLN: 2.0000 [drp] | OPHTHALMIC | Status: DC

## 2013-01-14 NOTE — Patient Instructions (Signed)

## 2013-01-14 NOTE — Telephone Encounter (Signed)
Appt given for today 

## 2013-01-14 NOTE — Progress Notes (Signed)
   Subjective:    Patient ID: ANIKEN MONESTIME, male    DOB: [birth year], 47 y.o.   MRN: [id number]  HPI This 47 y.o. male presents for evaluation of left eye discharge and sinus pressure. He has been having uri sx's.   Review of Systems No chest pain, SOB, HA, dizziness, vision change, N/V, diarrhea, constipation, dysuria, urinary urgency or frequency, myalgias, arthralgias or rash.     Objective:   Physical Exam Vital signs noted  Well developed well nourished male.  HEENT - Head atraumatic Normocephalic                Eyes - PERRLA, Conjuctiva - injected OS clear OD Sclera- Clear EOMI                Ears - EAC's Wnl TM's Wnl Gross Hearing WNL                Nose - Nares patent                 Throat - oropharanx wnl Respiratory - Lungs CTA bilateral Cardiac - RRR S1 and S2 without murmur GI - Abdomen soft Nontender and bowel sounds active x 4 Extremities - No edema. Neuro - Grossly intact.       Assessment & Plan:  Sore throat - Plan: amoxicillin-clavulanate (AUGMENTIN) 875-125 MG per tablet, ciprofloxacin (CILOXAN) 0.3 % ophthalmic solution  Sinusitis - Plan: amoxicillin-clavulanate (AUGMENTIN) 875-125 MG per tablet, ciprofloxacin (CILOXAN) 0.3 % ophthalmic solution  Conjunctivitis - Plan: amoxicillin-clavulanate (AUGMENTIN) 875-125 MG per tablet, ciprofloxacin (CILOXAN) 0.3 % ophthalmic solution  Push po fluids, rest, tylenol and motrin otc prn as directed for fever, arthralgias, and myalgias.  Follow up prn if sx's continue or persist.  Deatra Canter FNP

## 2013-02-20 ENCOUNTER — Other Ambulatory Visit: Payer: Self-pay | Admitting: Nurse Practitioner

## 2013-02-20 ENCOUNTER — Telehealth: Payer: Self-pay | Admitting: *Deleted

## 2013-02-20 MED ORDER — SIMVASTATIN 40 MG PO TABS: 40.0000 mg | ORAL_TABLET | Freq: Every day | ORAL | Status: DC

## 2013-02-20 NOTE — Telephone Encounter (Signed)
Patient stopped Lipitor due to it hurting his bones. Can he go back on simvastatin? [facility]

## 2013-02-20 NOTE — Telephone Encounter (Signed)
Med changed back to zocor- need to repeat labs in 3 months

## 2013-02-20 NOTE — Telephone Encounter (Signed)
LM, simvastatin sent in to [facility].

## 2013-03-15 ENCOUNTER — Encounter: Payer: BC Managed Care – PPO | Admitting: Nurse Practitioner

## 2013-03-15 ENCOUNTER — Telehealth: Payer: Self-pay | Admitting: Nurse Practitioner

## 2013-03-15 NOTE — Telephone Encounter (Signed)
Patient was asleep when I returned his call so I spoke with his wife.  Appt scheduled and she will relay the information.

## 2013-03-25 ENCOUNTER — Ambulatory Visit (INDEPENDENT_AMBULATORY_CARE_PROVIDER_SITE_OTHER): Payer: BC Managed Care – PPO | Admitting: Nurse Practitioner

## 2013-03-25 ENCOUNTER — Ambulatory Visit (INDEPENDENT_AMBULATORY_CARE_PROVIDER_SITE_OTHER): Payer: BC Managed Care – PPO

## 2013-03-25 ENCOUNTER — Encounter: Payer: Self-pay | Admitting: Nurse Practitioner

## 2013-03-25 VITALS — BP 141/95 | HR 75 | Temp 98.4°F | Ht 73.0 in | Wt 250.0 lb

## 2013-03-25 DIAGNOSIS — F17200 Nicotine dependence, unspecified, uncomplicated: Secondary | ICD-10-CM

## 2013-03-25 DIAGNOSIS — Z0000 Encounter for general adult medical examination without abnormal findings: Secondary | ICD-10-CM

## 2013-03-25 DIAGNOSIS — E785 Hyperlipidemia, unspecified: Secondary | ICD-10-CM

## 2013-03-25 DIAGNOSIS — K219 Gastro-esophageal reflux disease without esophagitis: Secondary | ICD-10-CM

## 2013-03-25 LAB — POCT CBC
Granulocyte percent: 71.2 %G (ref 37–80)
HCT, POC: 49.1 % (ref 43.5–53.7)
Hemoglobin: 15.4 g/dL (ref 14.1–18.1)
Lymph, poc: 3.4 (ref 0.6–3.4)
MCH, POC: 31.3 pg — AB (ref 27–31.2)
MCHC: 31.5 g/dL — AB (ref 31.8–35.4)
MCV: 99.6 fL — AB (ref 80–97)
MPV: 8.3 fL (ref 0–99.8)
POC Granulocyte: 9.5 — AB (ref 2–6.9)
POC LYMPH PERCENT: 25.6 %L (ref 10–50)
Platelet Count, POC: 234 10*3/uL (ref 142–424)
RBC: 4.9 M/uL (ref 4.69–6.13)
RDW, POC: 13.4 %
WBC: 13.4 10*3/uL — AB (ref 4.6–10.2)

## 2013-03-25 MED ORDER — ESOMEPRAZOLE MAGNESIUM 40 MG PO CPDR: 40.0000 mg | DELAYED_RELEASE_CAPSULE | Freq: Every day | ORAL | Status: DC

## 2013-03-25 MED ORDER — ACYCLOVIR 5 % EX OINT: 1.0000 "application " | TOPICAL_OINTMENT | CUTANEOUS | Status: DC

## 2013-03-25 MED ORDER — SIMVASTATIN 40 MG PO TABS: 40.0000 mg | ORAL_TABLET | Freq: Every day | ORAL | Status: DC

## 2013-03-25 NOTE — Progress Notes (Signed)
Subjective:    Patient ID: [name] [name], male    DOB: 11-05-1965, 48 y.o.   MRN: [id number]  PAtient here today for annual physical exam- doing well today without complaints.  Hyperlipidemia This is a chronic problem. The current episode started more than 1 year ago. The problem is uncontrolled. Recent lipid tests were reviewed and are high. Exacerbating diseases include obesity. He has no history of diabetes or hypothyroidism. There are no known factors aggravating his hyperlipidemia. Pertinent negatives include no focal sensory loss, focal weakness, leg pain, myalgias or shortness of breath. Current antihyperlipidemic treatment includes statins. The current treatment provides moderate improvement of lipids. Compliance problems include adherence to diet and adherence to exercise.  Risk factors for coronary artery disease include male sex and obesity.  COPD ONly  Uses symbicort occasionally- says that he usually feels fine without it- Still smokes Allergic rhinitis Hasn't used flonase in awhile but uses [facility] brand antihistamine daily. GERD Nexium works well to keep symptoms under control Chronic Low back pain Sees dr.Harkins at pain management for pain pills  Review of Systems  Constitutional: Negative.   HENT: Negative.   Respiratory: Negative.  Negative for shortness of breath.   Cardiovascular: Negative.   Gastrointestinal: Negative.   Genitourinary: Negative.   Musculoskeletal: Negative for myalgias.  Neurological: Negative for focal weakness.  All other systems reviewed and are negative.       Objective:   Physical Exam  Constitutional: He is oriented to person, place, and time. He appears well-developed and well-nourished.  HENT:  Head: Normocephalic.  Right Ear: External ear normal.  Left Ear: External ear normal.  Nose: Nose normal.  Mouth/Throat: Oropharynx is clear and moist.  Eyes: EOM are normal. Pupils are equal, round, and reactive to light.  Neck:  Normal range of motion. Neck supple. No JVD present. No thyromegaly present.  Cardiovascular: Normal rate, regular rhythm, normal heart sounds and intact distal pulses.  Exam reveals no gallop and no friction rub.   No murmur heard. Pulmonary/Chest: Effort normal and breath sounds normal. No respiratory distress. He has no wheezes. He has no rales. He exhibits no tenderness.  Abdominal: Soft. Bowel sounds are normal. He exhibits no mass. There is no tenderness.  Genitourinary:  Refuses prostate check  Musculoskeletal: Normal range of motion. He exhibits no edema.  Lymphadenopathy:    He has no cervical adenopathy.  Neurological: He is alert and oriented to person, place, and time. No cranial nerve deficit.  Skin: Skin is warm and dry.  Psychiatric: He has a normal mood and affect. His behavior is normal. Judgment and thought content normal.    BP 141/95  Pulse 75  Temp(Src) 98.4 F (36.9 C) (Oral)  Ht '6\' 1"'  (1.854 m)  Wt 250 lb (113.399 kg)  BMI 32.99 kg/m2  ekg- normal sinus rhythym. Preliminary reading by Ronnald Collum, FNP  [facility] Chest x-ray- cardiomegally-Preliminary reading by Ronnald Collum, FNP  [facility]     Assessment & Plan:   1. SMOKER   2. HYPERLIPIDEMIA   3. GERD (gastroesophageal reflux disease)   4. Annual physical exam    Orders Placed This Encounter  Procedures  . DG Chest 2 View    Standing Status: Future     Number of Occurrences:      Standing Expiration Date: 05/25/2014    Order Specific Question:  Reason for Exam (SYMPTOM  OR DIAGNOSIS REQUIRED)    Answer:  smoker    Order Specific Question:  Preferred imaging location?  Answer:  Internal  . CMP14+EGFR  . NMR, lipoprofile  . PSA, total and free  . POCT CBC  . EKG 12-Lead   Meds ordered this encounter  Medications  . acyclovir ointment (ZOVIRAX) 5 %    Sig: Apply 1 application topically every 3 (three) hours.    Dispense:  30 g    Refill:  3    Order Specific Question:  Supervising Provider     Answer:  Chipper Herb [[id number]]  . esomeprazole (NEXIUM) 40 MG capsule    Sig: Take 1 capsule (40 mg total) by mouth daily.    Dispense:  90 capsule    Refill:  1    Order Specific Question:  Supervising Provider    Answer:  Chipper Herb [[id number]]  . simvastatin (ZOCOR) 40 MG tablet    Sig: Take 1 tablet (40 mg total) by mouth at bedtime.    Dispense:  90 tablet    Refill:  3    Order Specific Question:  Supervising Provider    Answer:  Joycelyn Man  STOP SMOKING!!!! Continue all meds Labs pending Diet and exercise encouraged Health maintenance reviewed Follow up in 3 month   [place], FNP

## 2013-03-25 NOTE — Patient Instructions (Signed)
Smoking Cessation Quitting smoking is important to your health and has many advantages. However, it is not always easy to quit since nicotine is a very addictive drug. Often times, people try 3 times or more before being able to quit. This document explains the best ways for you to prepare to quit smoking. Quitting takes hard work and a lot of effort, but you can do it. ADVANTAGES OF QUITTING SMOKING  You will live longer, feel better, and live better.  Your body will feel the impact of quitting smoking almost immediately.  Within 20 minutes, blood pressure decreases. Your pulse returns to its normal level.  After 8 hours, carbon monoxide levels in the blood return to normal. Your oxygen level increases.  After 24 hours, the chance of having a heart attack starts to decrease. Your breath, hair, and body stop smelling like smoke.  After 48 hours, damaged nerve endings begin to recover. Your sense of taste and smell improve.  After 72 hours, the body is virtually free of nicotine. Your bronchial tubes relax and breathing becomes easier.  After 2 to 12 weeks, lungs can hold more air. Exercise becomes easier and circulation improves.  The risk of having a heart attack, stroke, cancer, or lung disease is greatly reduced.  After 1 year, the risk of coronary heart disease is cut in half.  After 5 years, the risk of stroke falls to the same as a nonsmoker.  After 10 years, the risk of lung cancer is cut in half and the risk of other cancers decreases significantly.  After 15 years, the risk of coronary heart disease drops, usually to the level of a nonsmoker.  If you are pregnant, quitting smoking will improve your chances of having a healthy baby.  The people you live with, especially any children, will be healthier.  You will have extra money to spend on things other than cigarettes. QUESTIONS TO THINK ABOUT BEFORE ATTEMPTING TO QUIT You may want to talk about your answers with your  caregiver.  Why do you want to quit?  If you tried to quit in the past, what helped and what did not?  What will be the most difficult situations for you after you quit? How will you plan to handle them?  Who can help you through the tough times? Your family? Friends? A caregiver?  What pleasures do you get from smoking? What ways can you still get pleasure if you quit? Here are some questions to ask your caregiver:  How can you help me to be successful at quitting?  What medicine do you think would be best for me and how should I take it?  What should I do if I need more help?  What is smoking withdrawal like? How can I get information on withdrawal? GET READY  Set a quit date.  Change your environment by getting rid of all cigarettes, ashtrays, matches, and lighters in your home, car, or work. Do not let people smoke in your home.  Review your past attempts to quit. Think about what worked and what did not. GET SUPPORT AND ENCOURAGEMENT You have a better chance of being successful if you have help. You can get support in many ways.  Tell your family, friends, and co-workers that you are going to quit and need their support. Ask them not to smoke around you.  Get individual, group, or telephone counseling and support. Programs are available at local hospitals and health centers. Call your local health department for   information about programs in your area.  Spiritual beliefs and practices may help some smokers quit.  Download a "quit meter" on your computer to keep track of quit statistics, such as how long you have gone without smoking, cigarettes not smoked, and money saved.  Get a self-help book about quitting smoking and staying off of tobacco. LEARN NEW SKILLS AND BEHAVIORS  Distract yourself from urges to smoke. Talk to someone, go for a walk, or occupy your time with a task.  Change your normal routine. Take a different route to work. Drink tea instead of coffee.  Eat breakfast in a different place.  Reduce your stress. Take a hot bath, exercise, or read a book.  Plan something enjoyable to do every day. Reward yourself for not smoking.  Explore interactive web-based programs that specialize in helping you quit. GET MEDICINE AND USE IT CORRECTLY Medicines can help you stop smoking and decrease the urge to smoke. Combining medicine with the above behavioral methods and support can greatly increase your chances of successfully quitting smoking.  Nicotine replacement therapy helps deliver nicotine to your body without the negative effects and risks of smoking. Nicotine replacement therapy includes nicotine gum, lozenges, inhalers, nasal sprays, and skin patches. Some may be available over-the-counter and others require a prescription.  Antidepressant medicine helps people abstain from smoking, but how this works is unknown. This medicine is available by prescription.  Nicotinic receptor partial agonist medicine simulates the effect of nicotine in your brain. This medicine is available by prescription. Ask your caregiver for advice about which medicines to use and how to use them based on your health history. Your caregiver will tell you what side effects to look out for if you choose to be on a medicine or therapy. Carefully read the information on the package. Do not use any other product containing nicotine while using a nicotine replacement product.  RELAPSE OR DIFFICULT SITUATIONS Most relapses occur within the first 3 months after quitting. Do not be discouraged if you start smoking again. Remember, most people try several times before finally quitting. You may have symptoms of withdrawal because your body is used to nicotine. You may crave cigarettes, be irritable, feel very hungry, cough often, get headaches, or have difficulty concentrating. The withdrawal symptoms are only temporary. They are strongest when you first quit, but they will go away within  10 14 days. To reduce the chances of relapse, try to:  Avoid drinking alcohol. Drinking lowers your chances of successfully quitting.  Reduce the amount of caffeine you consume. Once you quit smoking, the amount of caffeine in your body increases and can give you symptoms, such as a rapid heartbeat, sweating, and anxiety.  Avoid smokers because they can make you want to smoke.  Do not let weight gain distract you. Many smokers will gain weight when they quit, usually less than 10 pounds. Eat a healthy diet and stay active. You can always lose the weight gained after you quit.  Find ways to improve your mood other than smoking. FOR MORE INFORMATION  www.smokefree.gov  Document Released: 12/28/2000 Document Revised: 07/05/2011 Document Reviewed: 04/14/2011 [employer] Patient Information 2014 [employer].  

## 2013-03-27 LAB — CMP14+EGFR
ALT: 19 IU/L (ref 0–44)
AST: 13 IU/L (ref 0–40)
Albumin/Globulin Ratio: 1.6 (ref 1.1–2.5)
Albumin: 4.1 g/dL (ref 3.5–5.5)
Alkaline Phosphatase: 127 IU/L — ABNORMAL HIGH (ref 39–117)
BUN/Creatinine Ratio: 13 (ref 9–20)
BUN: 11 mg/dL (ref 6–24)
CO2: 22 mmol/L (ref 18–29)
Calcium: 9.3 mg/dL (ref 8.7–10.2)
Chloride: 99 mmol/L (ref 97–108)
Creatinine, Ser: 0.85 mg/dL (ref 0.76–1.27)
GFR calc Af Amer: 122 mL/min/{1.73_m2} (ref >59)
GFR calc non Af Amer: 105 mL/min/{1.73_m2} (ref >59)
Globulin, Total: 2.5 g/dL (ref 1.5–4.5)
Glucose: 90 mg/dL (ref 65–99)
Potassium: 4.1 mmol/L (ref 3.5–5.2)
Sodium: 137 mmol/L (ref 134–144)
Total Bilirubin: 0.4 mg/dL (ref 0.0–1.2)
Total Protein: 6.6 g/dL (ref 6.0–8.5)

## 2013-03-27 LAB — NMR, LIPOPROFILE
Cholesterol: 172 mg/dL (ref <200)
HDL Cholesterol by NMR: 40 mg/dL (ref >=40)
HDL Particle Number: 26.1 umol/L — ABNORMAL LOW (ref >=30.5)
LDL Particle Number: 1211 nmol/L — ABNORMAL HIGH (ref <1000)
LDL Size: 20.6 nm (ref >20.5)
LDLC SERPL CALC-MCNC: 118 mg/dL — ABNORMAL HIGH (ref <100)
LP-IR Score: 53 — ABNORMAL HIGH (ref <=45)
Small LDL Particle Number: 523 nmol/L (ref <=527)
Triglycerides by NMR: 71 mg/dL (ref <150)

## 2013-03-27 LAB — PSA, TOTAL AND FREE
PSA, Free Pct: 28 %
PSA, Free: 0.14 ng/mL
PSA: 0.5 ng/mL (ref 0.0–4.0)

## 2013-05-17 ENCOUNTER — Encounter: Payer: Self-pay | Admitting: *Deleted

## 2013-07-05 ENCOUNTER — Encounter: Payer: Self-pay | Admitting: Nurse Practitioner

## 2013-07-05 ENCOUNTER — Ambulatory Visit (INDEPENDENT_AMBULATORY_CARE_PROVIDER_SITE_OTHER): Payer: BC Managed Care – PPO | Admitting: Nurse Practitioner

## 2013-07-05 VITALS — BP 136/86 | HR 80 | Temp 98.0°F | Ht 73.0 in | Wt 248.8 lb

## 2013-07-05 DIAGNOSIS — E785 Hyperlipidemia, unspecified: Secondary | ICD-10-CM

## 2013-07-05 DIAGNOSIS — K219 Gastro-esophageal reflux disease without esophagitis: Secondary | ICD-10-CM

## 2013-07-05 MED ORDER — ESOMEPRAZOLE MAGNESIUM 40 MG PO CPDR: 40.0000 mg | DELAYED_RELEASE_CAPSULE | Freq: Every day | ORAL | Status: DC

## 2013-07-05 MED ORDER — SIMVASTATIN 40 MG PO TABS: 40.0000 mg | ORAL_TABLET | Freq: Every day | ORAL | Status: DC

## 2013-07-05 NOTE — Patient Instructions (Signed)

## 2013-07-05 NOTE — Progress Notes (Signed)
Subjective:    Patient ID: [name] [name], male    DOB: [birth year], 48 y.o.   MRN: [id number]  Patient here today for chronic follow up.   Hyperlipidemia This is a chronic problem. The current episode started more than 1 year ago. The problem is uncontrolled. Recent lipid tests were reviewed and are high. Exacerbating diseases include obesity. He has no history of diabetes or hypothyroidism. There are no known factors aggravating his hyperlipidemia. Pertinent negatives include no focal sensory loss, focal weakness, leg pain, myalgias or shortness of breath. Current antihyperlipidemic treatment includes statins. The current treatment provides moderate improvement of lipids. Compliance problems include adherence to diet and adherence to exercise.  Risk factors for coronary artery disease include male sex and obesity.  Gastrophageal Reflux This is a chronic problem. The current episode started more than 1 year ago. The problem occurs occasionally. Risk factors include NSAIDs.  COPD * Has albuterol at home as a rescue but does not ONly  Uses symbicort occasionally- says that he usually feels fine without it- Still smokes Allergic rhinitis Hasn't used flonase in awhile but uses [facility] brand antihistamine daily. Chronic Low back pain Sees dr.Harkins at pain management for pain pills  Review of Systems  Constitutional: Negative.   HENT: Negative.   Respiratory: Negative.  Negative for shortness of breath.   Cardiovascular: Negative.   Gastrointestinal: Negative.   Genitourinary: Negative.   Musculoskeletal: Negative for myalgias.  Neurological: Negative for focal weakness.  All other systems reviewed and are negative.      Objective:   Physical Exam  Constitutional: He is oriented to person, place, and time. He appears well-developed and well-nourished.  HENT:  Head: Normocephalic.  Right Ear: External ear normal.  Left Ear: External ear normal.  Nose: Nose normal.  Mouth/Throat:  Oropharynx is clear and moist.  Eyes: EOM are normal. Pupils are equal, round, and reactive to light.  Neck: Normal range of motion. Neck supple. No JVD present. No thyromegaly present.  Cardiovascular: Normal rate, regular rhythm, normal heart sounds and intact distal pulses.  Exam reveals no gallop and no friction rub.   No murmur heard. Pulmonary/Chest: Effort normal and breath sounds normal. No respiratory distress. He has no wheezes. He has no rales. He exhibits no tenderness.  Abdominal: Soft. Bowel sounds are normal. He exhibits no mass. There is no tenderness.  Genitourinary:  Refuses prostate check  Musculoskeletal: Normal range of motion. He exhibits no edema.  Lymphadenopathy:    He has no cervical adenopathy.  Neurological: He is alert and oriented to person, place, and time. No cranial nerve deficit.  Skin: Skin is warm and dry.  Psychiatric: He has a normal mood and affect. His behavior is normal. Judgment and thought content normal.    BP 136/86  Pulse 80  Temp(Src) 98 F (36.7 C) (Oral)  Ht _0  (1.854 m)  Wt 248 lb 12.8 oz (112.855 kg)  BMI 32.83 kg/m2       Assessment & Plan:   1. Gastroesophageal reflux disease without esophagitis   2. HYPERLIPIDEMIA    Orders Placed This Encounter  Procedures  . CMP14+EGFR  . NMR, lipoprofile    Meds ordered this encounter  Medications  . NUCYNTA 50 MG TABS tablet    Sig:   . esomeprazole (NEXIUM) 40 MG capsule    Sig: Take 1 capsule (40 mg total) by mouth daily.    Dispense:  30 capsule    Refill:  5    Order  Specific Question:  Supervising Provider    Answer:  Chipper Herb [[id number]]  . simvastatin (ZOCOR) 40 MG tablet    Sig: Take 1 tablet (40 mg total) by mouth at bedtime.    Dispense:  30 tablet    Refill:  5    Order Specific Question:  Supervising Provider    Answer:  Chipper Herb [[id number]]    Labs pending Health maintenance reviewed Diet and exercise encouraged Continue all meds Follow up  In 3  month   Vian, FNP

## 2013-07-06 ENCOUNTER — Other Ambulatory Visit: Payer: Self-pay | Admitting: Nurse Practitioner

## 2013-07-06 DIAGNOSIS — E785 Hyperlipidemia, unspecified: Secondary | ICD-10-CM

## 2013-07-06 LAB — NMR, LIPOPROFILE
Cholesterol: 170 mg/dL (ref 100–199)
HDL Cholesterol by NMR: 30 mg/dL — ABNORMAL LOW (ref >39)
HDL Particle Number: 24.3 umol/L — ABNORMAL LOW (ref >=30.5)
LDL Particle Number: 1537 nmol/L — ABNORMAL HIGH (ref <1000)
LDL Size: 20.7 nm (ref >20.5)
LDLC SERPL CALC-MCNC: 120 mg/dL — ABNORMAL HIGH (ref 0–99)
LP-IR Score: 60 — ABNORMAL HIGH (ref <=45)
Small LDL Particle Number: 732 nmol/L — ABNORMAL HIGH (ref <=527)
Triglycerides by NMR: 100 mg/dL (ref 0–149)

## 2013-07-06 LAB — CMP14+EGFR
ALT: 19 IU/L (ref 0–44)
AST: 15 IU/L (ref 0–40)
Albumin/Globulin Ratio: 1.8 (ref 1.1–2.5)
Albumin: 4.3 g/dL (ref 3.5–5.5)
Alkaline Phosphatase: 107 IU/L (ref 39–117)
BUN/Creatinine Ratio: 18 (ref 9–20)
BUN: 13 mg/dL (ref 6–24)
CO2: 22 mmol/L (ref 18–29)
Calcium: 9.2 mg/dL (ref 8.7–10.2)
Chloride: 102 mmol/L (ref 97–108)
Creatinine, Ser: 0.71 mg/dL — ABNORMAL LOW (ref 0.76–1.27)
GFR calc Af Amer: 131 mL/min/{1.73_m2} (ref >59)
GFR calc non Af Amer: 113 mL/min/{1.73_m2} (ref >59)
Globulin, Total: 2.4 g/dL (ref 1.5–4.5)
Glucose: 100 mg/dL — ABNORMAL HIGH (ref 65–99)
Potassium: 4.3 mmol/L (ref 3.5–5.2)
Sodium: 139 mmol/L (ref 134–144)
Total Bilirubin: 0.3 mg/dL (ref 0.0–1.2)
Total Protein: 6.7 g/dL (ref 6.0–8.5)

## 2013-07-06 MED ORDER — ATORVASTATIN CALCIUM 40 MG PO TABS: 40.0000 mg | ORAL_TABLET | Freq: Every day | ORAL | Status: DC

## 2013-10-11 ENCOUNTER — Ambulatory Visit (INDEPENDENT_AMBULATORY_CARE_PROVIDER_SITE_OTHER): Payer: BC Managed Care – PPO | Admitting: Nurse Practitioner

## 2013-10-11 ENCOUNTER — Encounter: Payer: Self-pay | Admitting: Nurse Practitioner

## 2013-10-11 ENCOUNTER — Ambulatory Visit (INDEPENDENT_AMBULATORY_CARE_PROVIDER_SITE_OTHER): Payer: BC Managed Care – PPO

## 2013-10-11 VITALS — BP 126/83 | HR 82 | Temp 98.3°F | Ht 73.0 in | Wt 249.0 lb

## 2013-10-11 DIAGNOSIS — M25522 Pain in left elbow: Secondary | ICD-10-CM

## 2013-10-11 DIAGNOSIS — M549 Dorsalgia, unspecified: Secondary | ICD-10-CM

## 2013-10-11 DIAGNOSIS — M25529 Pain in unspecified elbow: Secondary | ICD-10-CM

## 2013-10-11 DIAGNOSIS — E785 Hyperlipidemia, unspecified: Secondary | ICD-10-CM

## 2013-10-11 DIAGNOSIS — K219 Gastro-esophageal reflux disease without esophagitis: Secondary | ICD-10-CM

## 2013-10-11 DIAGNOSIS — G8929 Other chronic pain: Secondary | ICD-10-CM | POA: Insufficient documentation

## 2013-10-11 DIAGNOSIS — M7710 Lateral epicondylitis, unspecified elbow: Secondary | ICD-10-CM

## 2013-10-11 DIAGNOSIS — M7712 Lateral epicondylitis, left elbow: Secondary | ICD-10-CM

## 2013-10-11 MED ORDER — ATORVASTATIN CALCIUM 40 MG PO TABS: 40.0000 mg | ORAL_TABLET | Freq: Every day | ORAL | Status: DC

## 2013-10-11 MED ORDER — NAPROXEN 500 MG PO TABS: 500.0000 mg | ORAL_TABLET | Freq: Two times a day (BID) | ORAL | Status: DC

## 2013-10-11 MED ORDER — ESOMEPRAZOLE MAGNESIUM 40 MG PO CPDR: 40.0000 mg | DELAYED_RELEASE_CAPSULE | Freq: Every day | ORAL | Status: DC

## 2013-10-11 NOTE — Progress Notes (Signed)
Subjective:    Patient ID: [name] [name], male    DOB: 10-19-1965, 48 y.o.   MRN: [id number]  Patient here today for chronic follow up.   Hyperlipidemia This is a chronic problem. The current episode started more than 1 year ago. The problem is uncontrolled. Recent lipid tests were reviewed and are high. Exacerbating diseases include obesity. He has no history of diabetes or hypothyroidism. There are no known factors aggravating his hyperlipidemia. Pertinent negatives include no focal sensory loss, focal weakness, leg pain, myalgias or shortness of breath. Current antihyperlipidemic treatment includes statins. The current treatment provides moderate improvement of lipids. Compliance problems include adherence to diet and adherence to exercise.  Risk factors for coronary artery disease include male sex and obesity.  Gastrophageal Reflux This is a chronic problem. The current episode started more than 1 year ago. The problem occurs occasionally. Risk factors include NSAIDs.  COPD * Has albuterol at home as a rescue but does not ONly  Uses symbicort occasionally- says that he usually feels fine without it- Still smokes Allergic rhinitis Hasn't used flonase in awhile but uses [employer] brand antihistamine daily. Chronic Low back pain Sees dr.Harkins at pain management for pain pills  * c/o left elbow pain that started 2 months ago- has been wearing tennis elbow strap which has not helped- when he raises it up it hurts the worst. Not coming from cervical spine because he had a injection in neck which made no difference.  Review of Systems  Constitutional: Negative.   HENT: Negative.   Respiratory: Negative.  Negative for shortness of breath.   Cardiovascular: Negative.   Gastrointestinal: Negative.   Genitourinary: Negative.   Musculoskeletal: Negative for myalgias.  Neurological: Negative for focal weakness.  All other systems reviewed and are negative.      Objective:   Physical Exam   Constitutional: He is oriented to person, place, and time. He appears well-developed and well-nourished.  HENT:  Head: Normocephalic.  Right Ear: External ear normal.  Left Ear: External ear normal.  Nose: Nose normal.  Mouth/Throat: Oropharynx is clear and moist.  Eyes: EOM are normal. Pupils are equal, round, and reactive to light.  Neck: Normal range of motion. Neck supple. No JVD present. No thyromegaly present.  Cardiovascular: Normal rate, regular rhythm, normal heart sounds and intact distal pulses.  Exam reveals no gallop and no friction rub.   No murmur heard. Pulmonary/Chest: Effort normal and breath sounds normal. No respiratory distress. He has no wheezes. He has no rales. He exhibits no tenderness.  Abdominal: Soft. Bowel sounds are normal. He exhibits no mass. There is no tenderness.  Genitourinary:  Refuses prostate check  Musculoskeletal: Normal range of motion. He exhibits no edema.  Lymphadenopathy:    He has no cervical adenopathy.  Neurological: He is alert and oriented to person, place, and time. No cranial nerve deficit.  Skin: Skin is warm and dry.  Psychiatric: He has a normal mood and affect. His behavior is normal. Judgment and thought content normal.    BP 126/83  Pulse 82  Temp(Src) 98.3 F (36.8 C) (Oral)  Ht '6\' 1"'  (1.854 m)  Wt 249 lb (112.946 kg)  BMI 32.86 kg/m2  Left elbow x ray- normal-Preliminary reading by Ronnald Collum, FNP  [facility]       Assessment & Plan:   1. Gastroesophageal reflux disease without esophagitis Avoid spicy foods - esomeprazole (NEXIUM) 40 MG capsule; Take 1 capsule (40 mg total) by mouth daily.  Dispense: 30  capsule; Refill: 5  2. Chronic back pain Continue follow up with pain management  3. Elbow pain, left - DG Elbow 2 Views Left; Future  4. Tennis elbow, left Wear tennis elbow strap daily - naproxen (NAPROSYN) 500 MG tablet; Take 1 tablet (500 mg total) by mouth 2 (two) times daily with a meal.  Dispense: 30  tablet; Refill: 0  5. Hyperlipidemia LDL goal <100 Low carb diet - atorvastatin (LIPITOR) 40 MG tablet; Take 1 tablet (40 mg total) by mouth daily.  Dispense: 30 tablet; Refill: 5 - CMP14+EGFR - NMR, lipoprofile  Labs pending Health maintenance reviewed Diet and exercise encouraged Continue all meds Follow up  In 3 months   [place], FNP

## 2013-10-11 NOTE — Patient Instructions (Signed)
Tennis Elbow Your caregiver has diagnosed you with a condition often referred to as "tennis elbow." This results from small tears or soreness (inflammation) at the start (origin) of the extensor muscles of the forearm. Although the condition is often called tennis or golfer's elbow, it is caused by any repetitive action performed by your elbow. HOME CARE INSTRUCTIONS  If the condition has been short lived, rest may be the only treatment required. Using your opposite hand or arm to perform the task may help. Even changing your grip may help rest the extremity. These may even prevent the condition from recurring.  Longer standing problems, however, will often be relieved faster by:  Using anti-inflammatory agents.  Applying ice packs for 30 minutes at the end of the working day, at bed time, or when activities are finished.  Your caregiver may also have you wear a splint or sling. This will allow the inflamed tendon to heal. At times, steroid injections aided with a local anesthetic will be required along with splinting for 1 to 2 weeks. Two to three steroid injections will often solve the problem. In some long standing cases, the inflamed tendon does not respond to conservative (non-surgical) therapy. Then surgery may be required to repair it. MAKE SURE YOU:   Understand these instructions.  Will watch your condition.  Will get help right away if you are not doing well or get worse. Document Released: 01/03/2005 Document Revised: 03/28/2011 Document Reviewed: 08/22/2007 [employer] Patient Information 2015 [employer]. This information is not intended to replace advice given to you by your health care provider. Make sure you discuss any questions you have with your health care provider.  

## 2013-10-12 LAB — CMP14+EGFR
ALT: 16 IU/L (ref 0–44)
AST: 13 IU/L (ref 0–40)
Albumin/Globulin Ratio: 1.7 (ref 1.1–2.5)
Albumin: 4.2 g/dL (ref 3.5–5.5)
Alkaline Phosphatase: 108 IU/L (ref 39–117)
BUN/Creatinine Ratio: 18 (ref 9–20)
BUN: 14 mg/dL (ref 6–24)
CO2: 23 mmol/L (ref 18–29)
Calcium: 9 mg/dL (ref 8.7–10.2)
Chloride: 99 mmol/L (ref 97–108)
Creatinine, Ser: 0.78 mg/dL (ref 0.76–1.27)
GFR calc Af Amer: 126 mL/min/{1.73_m2} (ref >59)
GFR calc non Af Amer: 109 mL/min/{1.73_m2} (ref >59)
Globulin, Total: 2.5 g/dL (ref 1.5–4.5)
Glucose: 96 mg/dL (ref 65–99)
Potassium: 4.3 mmol/L (ref 3.5–5.2)
Sodium: 139 mmol/L (ref 134–144)
Total Bilirubin: 0.3 mg/dL (ref 0.0–1.2)
Total Protein: 6.7 g/dL (ref 6.0–8.5)

## 2013-10-12 LAB — NMR, LIPOPROFILE
Cholesterol: 177 mg/dL (ref 100–199)
HDL Cholesterol by NMR: 32 mg/dL — ABNORMAL LOW (ref >39)
HDL Particle Number: 24.9 umol/L — ABNORMAL LOW (ref >=30.5)
LDL Particle Number: 1263 nmol/L — ABNORMAL HIGH (ref <1000)
LDL Size: 21 nm (ref >20.5)
LDLC SERPL CALC-MCNC: 122 mg/dL — ABNORMAL HIGH (ref 0–99)
LP-IR Score: 56 — ABNORMAL HIGH (ref <=45)
Small LDL Particle Number: 92 nmol/L (ref <=527)
Triglycerides by NMR: 114 mg/dL (ref 0–149)

## 2013-10-28 ENCOUNTER — Ambulatory Visit (INDEPENDENT_AMBULATORY_CARE_PROVIDER_SITE_OTHER): Payer: BC Managed Care – PPO | Admitting: Nurse Practitioner

## 2013-10-28 ENCOUNTER — Encounter: Payer: Self-pay | Admitting: Nurse Practitioner

## 2013-10-28 VITALS — BP 124/82 | HR 92 | Temp 98.3°F | Ht 73.0 in | Wt 239.6 lb

## 2013-10-28 DIAGNOSIS — R197 Diarrhea, unspecified: Secondary | ICD-10-CM

## 2013-10-28 NOTE — Addendum Note (Signed)
Addended by: Prescott GumLAND, Kailyn Vanderslice M on: 10/28/2013 04:51 PM   Modules accepted: Orders

## 2013-10-28 NOTE — Patient Instructions (Signed)

## 2013-10-28 NOTE — Progress Notes (Signed)
   Subjective:    Patient ID: [name] [name], male    DOB: 02/16/1965, 48 y.o.   MRN: [id number]  HPI Patient in c/o nausea, vomiting and diarrhea- Started 2 days ago- took imodium AD 1 X  And pepto bismol- nausea nad vomiting has resolved but diarrhea unchanged.    Review of Systems  Constitutional: Negative.  Negative for fever and chills.  HENT: Negative.   Respiratory: Negative.   Cardiovascular: Negative.   Gastrointestinal: Positive for diarrhea.  Genitourinary: Negative.   Neurological: Negative.   Psychiatric/Behavioral: Negative.        Objective:   Physical Exam  Constitutional: He is oriented to person, place, and time. He appears well-developed and well-nourished. No distress.  Cardiovascular: Normal rate, regular rhythm and normal heart sounds.   Pulmonary/Chest: Effort normal and breath sounds normal.  Abdominal: Soft. Bowel sounds are normal.  Neurological: He is alert and oriented to person, place, and time.  Skin: Skin is warm and dry.  Psychiatric: He has a normal mood and affect. His behavior is normal. Judgment and thought content normal.   BP 124/82  Pulse 92  Temp(Src) 98.3 F (36.8 C) (Oral)  Ht 6\' 1"  (1.854 m)  Wt 239 lb 9.6 oz (108.682 kg)  BMI 31.62 kg/m2        Assessment & Plan:   1. Diarrhea    Force fluids Stool specimen containers given Imodium AD OTC as indicated RTO prn  Mary-Margaret Daphine DeutscherMartin, FNP

## 2013-10-30 LAB — CLOSTRIDIUM DIFFICILE BY PCR: Toxigenic C. Difficile by PCR: NEGATIVE

## 2013-10-31 LAB — OVA AND PARASITE EXAMINATION

## 2013-12-13 LAB — STOOL CULTURE: E coli, Shiga toxin Assay: NEGATIVE

## 2013-12-18 LAB — STATE LABORATORY REPORT

## 2014-01-09 ENCOUNTER — Encounter: Payer: Self-pay | Admitting: Family Medicine

## 2014-01-09 ENCOUNTER — Ambulatory Visit (INDEPENDENT_AMBULATORY_CARE_PROVIDER_SITE_OTHER): Payer: BC Managed Care – PPO | Admitting: Family Medicine

## 2014-01-09 ENCOUNTER — Telehealth: Payer: Self-pay | Admitting: Family Medicine

## 2014-01-09 VITALS — BP 129/88 | HR 101 | Temp 97.6°F | Ht 73.0 in | Wt 243.0 lb

## 2014-01-09 DIAGNOSIS — J029 Acute pharyngitis, unspecified: Secondary | ICD-10-CM

## 2014-01-09 LAB — POCT RAPID STREP A (OFFICE): Rapid Strep A Screen: NEGATIVE

## 2014-01-09 MED ORDER — AMOXICILLIN-POT CLAVULANATE 875-125 MG PO TABS: 1.0000 | ORAL_TABLET | Freq: Two times a day (BID) | ORAL | Status: DC

## 2014-01-09 NOTE — Progress Notes (Signed)
S: 2-3 day history of headache sounds and right ear drainage. He's also had a sore throat and had fever night before last that has since resolved. When he coughs the drainage has a green color to it. He does have a history of sinusitis  O: Vital signs blood pressure 129/88, pulse 101, temp 97.6, weight 243  Sinuses are tender to percussion Ears right TM is wet appearing and there has been some drainage in the external auditory canal. The left tympanic membrane is scarred having had several surgeries in the past by history. Throat is minimally injected there is no exudate and there is no significant adenopathy Chest lungs are clear to auscultation  Rapid strep test is negative  A,P: Probable sinusitis and otitis         Augmentin 875 mg twice a day for 10 days, Mucinex, plenty of fluids and hot showers to loosen mucus  Philip AspenStephen M Guhan Bruington M.D.

## 2014-01-09 NOTE — Telephone Encounter (Signed)
Patient in parking lot. Told him to come in and be triaged.

## 2014-01-31 ENCOUNTER — Encounter: Payer: Self-pay | Admitting: Nurse Practitioner

## 2014-01-31 ENCOUNTER — Ambulatory Visit (INDEPENDENT_AMBULATORY_CARE_PROVIDER_SITE_OTHER): Payer: BLUE CROSS/BLUE SHIELD | Admitting: Nurse Practitioner

## 2014-01-31 VITALS — BP 124/81 | HR 84 | Temp 97.2°F | Ht 73.0 in | Wt 244.0 lb

## 2014-01-31 DIAGNOSIS — Z23 Encounter for immunization: Secondary | ICD-10-CM

## 2014-01-31 DIAGNOSIS — G8929 Other chronic pain: Secondary | ICD-10-CM

## 2014-01-31 DIAGNOSIS — F17200 Nicotine dependence, unspecified, uncomplicated: Secondary | ICD-10-CM

## 2014-01-31 DIAGNOSIS — Z720 Tobacco use: Secondary | ICD-10-CM

## 2014-01-31 DIAGNOSIS — K219 Gastro-esophageal reflux disease without esophagitis: Secondary | ICD-10-CM

## 2014-01-31 DIAGNOSIS — Z6832 Body mass index (BMI) 32.0-32.9, adult: Secondary | ICD-10-CM

## 2014-01-31 DIAGNOSIS — M549 Dorsalgia, unspecified: Secondary | ICD-10-CM

## 2014-01-31 DIAGNOSIS — Z0000 Encounter for general adult medical examination without abnormal findings: Secondary | ICD-10-CM

## 2014-01-31 DIAGNOSIS — E785 Hyperlipidemia, unspecified: Secondary | ICD-10-CM

## 2014-01-31 MED ORDER — ATORVASTATIN CALCIUM 40 MG PO TABS: 40.0000 mg | ORAL_TABLET | Freq: Every day | ORAL | Status: DC

## 2014-01-31 MED ORDER — ESOMEPRAZOLE MAGNESIUM 40 MG PO CPDR: 40.0000 mg | DELAYED_RELEASE_CAPSULE | Freq: Every day | ORAL | Status: DC

## 2014-01-31 NOTE — Addendum Note (Signed)
Addended by: Bennie PieriniMARTIN, MARY-MARGARET on: 01/31/2014 08:48 AM   Modules accepted: Orders

## 2014-01-31 NOTE — Patient Instructions (Signed)

## 2014-01-31 NOTE — Progress Notes (Signed)
Subjective:    Patient ID: [name] [name], male    DOB: Nov 10, 1965, 49 y.o.   MRN: [id number]  Patient here today for CPE- he is doing well today without complaints.  Hyperlipidemia This is a chronic problem. The current episode started more than 1 year ago. The problem is controlled. Recent lipid tests were reviewed and are normal. Pertinent negatives include no myalgias or shortness of breath. Current antihyperlipidemic treatment includes statins. The current treatment provides moderate improvement of lipids. Compliance problems include adherence to diet and adherence to exercise.  Risk factors for coronary artery disease include dyslipidemia, family history, hypertension and obesity.  COPD Has albuterol at home as a rescue but does not ONly  Uses symbicort occasionally- says that he usually feels fine without it- Still smokes Allergic rhinitis Hasn't used flonase in awhile but uses [employer] brand antihistamine daily. Chronic Low back pain Sees dr.Harkins at pain management for pain pills GERD Patient is on nexium and works well for symptoms      Review of Systems  Constitutional: Negative.   HENT: Negative.   Respiratory: Negative.  Negative for shortness of breath.   Cardiovascular: Negative.   Gastrointestinal: Negative.   Genitourinary: Negative.   Musculoskeletal: Negative for myalgias.  Neurological: Negative.   Psychiatric/Behavioral: Negative.   All other systems reviewed and are negative.      Objective:   Physical Exam  Constitutional: He is oriented to person, place, and time. He appears well-developed and well-nourished.  HENT:  Head: Normocephalic.  Right Ear: External ear normal.  Left Ear: External ear normal.  Nose: Nose normal.  Mouth/Throat: Oropharynx is clear and moist.  Eyes: EOM are normal. Pupils are equal, round, and reactive to light.  Neck: Normal range of motion. Neck supple. No JVD present. No thyromegaly present.  Cardiovascular: Normal  rate, regular rhythm, normal heart sounds and intact distal pulses.  Exam reveals no gallop and no friction rub.   No murmur heard. Pulmonary/Chest: Effort normal and breath sounds normal. No respiratory distress. He has no wheezes. He has no rales. He exhibits no tenderness.  Abdominal: Soft. Bowel sounds are normal. He exhibits no mass. There is no tenderness.  Genitourinary:  Refuses prostate check  Musculoskeletal: Normal range of motion. He exhibits no edema.  Lymphadenopathy:    He has no cervical adenopathy.  Neurological: He is alert and oriented to person, place, and time. No cranial nerve deficit.  Skin: Skin is warm and dry.  Psychiatric: He has a normal mood and affect. His behavior is normal. Judgment and thought content normal.    BP 124/81 mmHg  Pulse 84  Temp(Src) 97.2 F (36.2 C) (Oral)  Ht  (1.854 m)  Wt 244 lb (110.678 kg)  BMI 32.20 kg/m2       Assessment & Plan:   1. BMI 32.0-32.9,adult Discussed diet and exercise for person with BMI >25 Will recheck weight in 3-6 months  2. Gastroesophageal reflux disease without esophagitis Watch spicy foods - esomeprazole (NEXIUM) 40 MG capsule; Take 1 capsule (40 mg total) by mouth daily.  Dispense: 30 capsule; Refill: 5  3. Hyperlipidemia with target LDL less than 100 Low fat diet - atorvastatin (LIPITOR) 40 MG tablet; Take 1 tablet (40 mg total) by mouth daily.  Dispense: 30 tablet; Refill: 5   4. SMOKER SMOKING CESSATION ENCOURAGED  5. Chronic back pain Continue appointments at pain manageent   Labs pending Health maintenance reviewed Diet and exercise encouraged Continue all meds Follow up  In 3 months   Mary-Margaret Daphine DeutscherMartin, FNP

## 2014-02-01 LAB — NMR, LIPOPROFILE
Cholesterol: 150 mg/dL (ref 100–199)
HDL Cholesterol by NMR: 35 mg/dL — ABNORMAL LOW (ref >39)
HDL Particle Number: 22.6 umol/L — ABNORMAL LOW (ref >=30.5)
LDL Particle Number: 1051 nmol/L — ABNORMAL HIGH (ref <1000)
LDL Size: 20.4 nm (ref >20.5)
LDL-C: 102 mg/dL — ABNORMAL HIGH (ref 0–99)
LP-IR Score: 65 — ABNORMAL HIGH (ref <=45)
Small LDL Particle Number: 450 nmol/L (ref <=527)
Triglycerides by NMR: 63 mg/dL (ref 0–149)

## 2014-02-01 LAB — CMP14+EGFR
ALT: 20 IU/L (ref 0–44)
AST: 15 IU/L (ref 0–40)
Albumin/Globulin Ratio: 1.6 (ref 1.1–2.5)
Albumin: 4.1 g/dL (ref 3.5–5.5)
Alkaline Phosphatase: 127 IU/L — ABNORMAL HIGH (ref 39–117)
BUN/Creatinine Ratio: 16 (ref 9–20)
BUN: 13 mg/dL (ref 6–24)
CO2: 23 mmol/L (ref 18–29)
Calcium: 9 mg/dL (ref 8.7–10.2)
Chloride: 101 mmol/L (ref 97–108)
Creatinine, Ser: 0.82 mg/dL (ref 0.76–1.27)
GFR calc Af Amer: 123 mL/min/1.73 (ref >59)
GFR calc non Af Amer: 106 mL/min/1.73 (ref >59)
Globulin, Total: 2.6 g/dL (ref 1.5–4.5)
Glucose: 86 mg/dL (ref 65–99)
Potassium: 4.4 mmol/L (ref 3.5–5.2)
Sodium: 141 mmol/L (ref 134–144)
Total Bilirubin: 0.4 mg/dL (ref 0.0–1.2)
Total Protein: 6.7 g/dL (ref 6.0–8.5)

## 2014-02-20 ENCOUNTER — Other Ambulatory Visit: Payer: Self-pay

## 2014-02-20 MED ORDER — ACYCLOVIR 5 % EX OINT: 1.0000 "application " | TOPICAL_OINTMENT | CUTANEOUS | Status: DC

## 2014-02-20 NOTE — Telephone Encounter (Signed)
Last seen 01/31/14 MMM 

## 2014-10-01 ENCOUNTER — Telehealth: Payer: Self-pay | Admitting: Nurse Practitioner

## 2014-10-02 ENCOUNTER — Ambulatory Visit (INDEPENDENT_AMBULATORY_CARE_PROVIDER_SITE_OTHER): Payer: BLUE CROSS/BLUE SHIELD | Admitting: Family Medicine

## 2014-10-02 ENCOUNTER — Encounter: Payer: Self-pay | Admitting: Family Medicine

## 2014-10-02 VITALS — BP 123/87 | HR 78 | Temp 98.0°F | Ht 73.0 in | Wt 246.4 lb

## 2014-10-02 DIAGNOSIS — H6693 Otitis media, unspecified, bilateral: Secondary | ICD-10-CM | POA: Diagnosis not present

## 2014-10-02 DIAGNOSIS — H60393 Other infective otitis externa, bilateral: Secondary | ICD-10-CM

## 2014-10-02 MED ORDER — OFLOXACIN 0.3 % OT SOLN: 5.0000 [drp] | Freq: Every day | OTIC | Status: DC

## 2014-10-02 MED ORDER — AZITHROMYCIN 250 MG PO TABS: ORAL_TABLET | ORAL | Status: DC

## 2014-10-02 NOTE — Progress Notes (Signed)
BP 123/87 mmHg  Pulse 78  Temp(Src) 98 F (36.7 C) (Oral)  Ht _0  (1.854 m)  Wt 246 lb 6.4 oz (111.766 kg)  BMI 32.52 kg/m2   Subjective:    Patient ID: [name] [name], male    DOB: 17-May-1965, 49 y.o.   MRN: [id number]  HPI: [name] [name] is a 49 y.o. male presenting on 10/02/2014 for Ear Pain   HPI Ear pain A she has ear pain bilaterally worse right than left. He denies any fevers or chills. Ear pain is been going on for a couple of days. Prior to the ear pain he had sinus pressure and nasal congestion and sore throat which have since improved. He still does have some nasal drainage left. He did not really have to take anything for those to improve.  Relevant past medical, surgical, family and social history reviewed and updated as indicated. Interim medical history since our last visit reviewed. Allergies and medications reviewed and updated.  Review of Systems  Constitutional: Negative for fever and chills.  HENT: Positive for congestion, ear pain, postnasal drip and rhinorrhea. Negative for ear discharge, sinus pressure, sneezing, sore throat and voice change.   Eyes: Negative for pain, discharge, redness and visual disturbance.  Respiratory: Positive for cough. Negative for shortness of breath and wheezing.   Cardiovascular: Negative for chest pain and leg swelling.  Gastrointestinal: Negative for abdominal pain, diarrhea and constipation.  Genitourinary: Negative for difficulty urinating.  Musculoskeletal: Negative for back pain and gait problem.  Skin: Negative for rash.  Neurological: Negative for dizziness, syncope, light-headedness and headaches.  All other systems reviewed and are negative.   Per HPI unless specifically indicated above     Medication List       This list is accurate as of: 10/02/14  9:56 AM.  Always use your most recent med list.               acyclovir ointment 5 %  Commonly known as:  ZOVIRAX  Apply 1 application topically  every 3 (three) hours.     atorvastatin 40 MG tablet  Commonly known as:  LIPITOR  Take 1 tablet (40 mg total) by mouth daily.     azithromycin 250 MG tablet  Commonly known as:  ZITHROMAX  Take 2 the first day and then one each day after.     budesonide-formoterol 160-4.5 MCG/ACT inhaler  Commonly known as:  SYMBICORT  Inhale 2 puffs into the lungs as needed.     esomeprazole 40 MG capsule  Commonly known as:  NEXIUM  Take 1 capsule (40 mg total) by mouth daily.     HYDROcodone-acetaminophen 10-325 MG per tablet  Commonly known as:  NORCO     loratadine 10 MG tablet  Commonly known as:  CLARITIN  Take 10 mg by mouth daily.     ofloxacin 0.3 % otic solution  Commonly known as:  FLOXIN OTIC  Place 5 drops into both ears daily.           Objective:    BP 123/87 mmHg  Pulse 78  Temp(Src) 98 F (36.7 C) (Oral)  Ht _1  (1.854 m)  Wt 246 lb 6.4 oz (111.766 kg)  BMI 32.52 kg/m2  Wt Readings from Last 3 Encounters:  10/02/14 246 lb 6.4 oz (111.766 kg)  01/31/14 244 lb (110.678 kg)  01/09/14 243 lb (110.224 kg)    Physical Exam  Constitutional: He is oriented to person, place, and time. He  appears well-developed and well-nourished. No distress.  HENT:  Right Ear: There is drainage and tenderness. Tympanic membrane is injected, erythematous and bulging. Tympanic membrane is not scarred and not perforated.  Left Ear: Ear canal normal. There is tenderness. No mastoid tenderness. Tympanic membrane is injected, erythematous and bulging. Tympanic membrane is not scarred and not perforated.  Ears:  Nose: Mucosal edema and rhinorrhea present. Right sinus exhibits no maxillary sinus tenderness and no frontal sinus tenderness. Left sinus exhibits no maxillary sinus tenderness and no frontal sinus tenderness.  Mouth/Throat: Uvula is midline and mucous membranes are normal. Posterior oropharyngeal edema present. No oropharyngeal exudate, posterior oropharyngeal erythema or  tonsillar abscesses.  Eyes: Conjunctivae and EOM are normal. Pupils are equal, round, and reactive to light. Right eye exhibits no discharge. No scleral icterus.  Neck: Neck supple. No thyromegaly present.  Cardiovascular: Normal rate, regular rhythm, normal heart sounds and intact distal pulses.   No murmur heard. Pulmonary/Chest: Effort normal and breath sounds normal. No respiratory distress. He has no wheezes.  Musculoskeletal: Normal range of motion. He exhibits no edema.  Lymphadenopathy:    He has no cervical adenopathy.  Neurological: He is alert and oriented to person, place, and time. Coordination normal.  Skin: Skin is warm and dry. No rash noted. He is not diaphoretic.  Psychiatric: He has a normal mood and affect. His behavior is normal.  Vitals reviewed.   Results for orders placed or performed in visit on 01/31/14  CMP14+EGFR  Result Value Ref Range   Glucose 86 65 - 99 mg/dL   BUN 13 6 - 24 mg/dL   Creatinine, Ser 0.82 0.76 - 1.27 mg/dL   GFR calc non Af Amer 106 >59 mL/min/1.73   GFR calc Af Amer 123 >59 mL/min/1.73   BUN/Creatinine Ratio 16 9 - 20   Sodium 141 134 - 144 mmol/L   Potassium 4.4 3.5 - 5.2 mmol/L   Chloride 101 97 - 108 mmol/L   CO2 23 18 - 29 mmol/L   Calcium 9.0 8.7 - 10.2 mg/dL   Total Protein 6.7 6.0 - 8.5 g/dL   Albumin 4.1 3.5 - 5.5 g/dL   Globulin, Total 2.6 1.5 - 4.5 g/dL   Albumin/Globulin Ratio 1.6 1.1 - 2.5   Total Bilirubin 0.4 0.0 - 1.2 mg/dL   Alkaline Phosphatase 127 (H) 39 - 117 IU/L   AST 15 0 - 40 IU/L   ALT 20 0 - 44 IU/L  NMR, lipoprofile  Result Value Ref Range   LDL Particle Number 1051 (H) <1000 nmol/L   LDL-C 102 (H) 0 - 99 mg/dL   HDL Cholesterol by NMR 35 (L) >39 mg/dL   Triglycerides by NMR 63 0 - 149 mg/dL   Cholesterol 150 100 - 199 mg/dL   HDL Particle Number 22.6 (L) >=30.5 umol/L   Small LDL Particle Number 450 <=527 nmol/L   LDL Size 20.4 >20.5 nm   LP-IR Score 65 (H) <=45      Assessment & Plan:    Problem List Items Addressed This Visit    None    Visit Diagnoses    Bilateral acute otitis media, recurrence not specified, unspecified otitis media type    -  Primary    Patient has ear pain and ear infection bilaterally.    Relevant Medications    azithromycin (ZITHROMAX) 250 MG tablet    Otitis, externa, infective, bilateral        Relevant Medications    azithromycin (ZITHROMAX) 250 MG  tablet    ofloxacin (FLOXIN OTIC) 0.3 % otic solution        Follow up plan: Return if symptoms worsen or fail to improve.  Caryl Pina, MD [facility] 10/02/2014, 9:56 AM

## 2014-10-06 ENCOUNTER — Encounter: Payer: Self-pay | Admitting: *Deleted

## 2014-10-28 ENCOUNTER — Ambulatory Visit (INDEPENDENT_AMBULATORY_CARE_PROVIDER_SITE_OTHER): Payer: BLUE CROSS/BLUE SHIELD | Admitting: Nurse Practitioner

## 2014-10-28 ENCOUNTER — Encounter: Payer: Self-pay | Admitting: Nurse Practitioner

## 2014-10-28 VITALS — BP 133/89 | HR 67 | Temp 97.0°F | Ht 73.0 in | Wt 252.0 lb

## 2014-10-28 DIAGNOSIS — F17200 Nicotine dependence, unspecified, uncomplicated: Secondary | ICD-10-CM | POA: Diagnosis not present

## 2014-10-28 DIAGNOSIS — G8929 Other chronic pain: Secondary | ICD-10-CM | POA: Diagnosis not present

## 2014-10-28 DIAGNOSIS — H6504 Acute serous otitis media, recurrent, right ear: Secondary | ICD-10-CM | POA: Diagnosis not present

## 2014-10-28 DIAGNOSIS — J302 Other seasonal allergic rhinitis: Secondary | ICD-10-CM

## 2014-10-28 DIAGNOSIS — Z6833 Body mass index (BMI) 33.0-33.9, adult: Secondary | ICD-10-CM | POA: Diagnosis not present

## 2014-10-28 DIAGNOSIS — K219 Gastro-esophageal reflux disease without esophagitis: Secondary | ICD-10-CM

## 2014-10-28 DIAGNOSIS — Z23 Encounter for immunization: Secondary | ICD-10-CM

## 2014-10-28 DIAGNOSIS — M549 Dorsalgia, unspecified: Secondary | ICD-10-CM

## 2014-10-28 DIAGNOSIS — E785 Hyperlipidemia, unspecified: Secondary | ICD-10-CM | POA: Diagnosis not present

## 2014-10-28 MED ORDER — ATORVASTATIN CALCIUM 40 MG PO TABS: 40.0000 mg | ORAL_TABLET | Freq: Every day | ORAL | Status: DC

## 2014-10-28 MED ORDER — LORATADINE 10 MG PO TABS: 10.0000 mg | ORAL_TABLET | Freq: Every day | ORAL | Status: DC

## 2014-10-28 MED ORDER — AMOXICILLIN 875 MG PO TABS: 875.0000 mg | ORAL_TABLET | Freq: Two times a day (BID) | ORAL | Status: DC

## 2014-10-28 MED ORDER — ESOMEPRAZOLE MAGNESIUM 40 MG PO CPDR: 40.0000 mg | DELAYED_RELEASE_CAPSULE | Freq: Every day | ORAL | Status: DC

## 2014-10-28 NOTE — Progress Notes (Signed)
   Subjective:    Patient ID: [name] [name], male    DOB: [birth year], 49 y.o.   MRN: [id number]  HPI    Review of Systems     Objective:   Physical Exam        Assessment & Plan:

## 2014-10-28 NOTE — Patient Instructions (Signed)
Smoking Cessation, Tips for Success If you are ready to quit smoking, congratulations! You have chosen to help yourself be healthier. Cigarettes bring nicotine, tar, carbon monoxide, and other irritants into your body. Your lungs, heart, and blood vessels will be able to work better without these poisons. There are many different ways to quit smoking. Nicotine gum, nicotine patches, a nicotine inhaler, or nicotine nasal spray can help with physical craving. Hypnosis, support groups, and medicines help break the habit of smoking. WHAT THINGS CAN I DO TO MAKE QUITTING EASIER?  Here are some tips to help you quit for good:  Pick a date when you will quit smoking completely. Tell all of your friends and family about your plan to quit on that date.  Do not try to slowly cut down on the number of cigarettes you are smoking. Pick a quit date and quit smoking completely starting on that day.  Throw away all cigarettes.   Clean and remove all ashtrays from your home, work, and car.  On a card, write down your reasons for quitting. Carry the card with you and read it when you get the urge to smoke.  Cleanse your body of nicotine. Drink enough water and fluids to keep your urine clear or pale yellow. Do this after quitting to flush the nicotine from your body.  Learn to predict your moods. Do not let a bad situation be your excuse to have a cigarette. Some situations in your life might tempt you into wanting a cigarette.  Never have "just one" cigarette. It leads to wanting another and another. Remind yourself of your decision to quit.  Change habits associated with smoking. If you smoked while driving or when feeling stressed, try other activities to replace smoking. Stand up when drinking your coffee. Brush your teeth after eating. Sit in a different chair when you read the paper. Avoid alcohol while trying to quit, and try to drink fewer caffeinated beverages. Alcohol and caffeine may urge you to  smoke.  Avoid foods and drinks that can trigger a desire to smoke, such as sugary or spicy foods and alcohol.  Ask people who smoke not to smoke around you.  Have something planned to do right after eating or having a cup of coffee. For example, plan to take a walk or exercise.  Try a relaxation exercise to calm you down and decrease your stress. Remember, you may be tense and nervous for the first 2 weeks after you quit, but this will pass.  Find new activities to keep your hands busy. Play with a pen, coin, or rubber band. Doodle or draw things on paper.  Brush your teeth right after eating. This will help cut down on the craving for the taste of tobacco after meals. You can also try mouthwash.   Use oral substitutes in place of cigarettes. Try using lemon drops, carrots, cinnamon sticks, or chewing gum. Keep them handy so they are available when you have the urge to smoke.  When you have the urge to smoke, try deep breathing.  Designate your home as a nonsmoking area.  If you are a heavy smoker, ask your health care provider about a prescription for nicotine chewing gum. It can ease your withdrawal from nicotine.  Reward yourself. Set aside the cigarette money you save and buy yourself something nice.  Look for support from others. Join a support group or smoking cessation program. Ask someone at home or at work to help you with your plan   to quit smoking.  Always ask yourself, "Do I need this cigarette or is this just a reflex?" Tell yourself, "Today, I choose not to smoke," or "I do not want to smoke." You are reminding yourself of your decision to quit.  Do not replace cigarette smoking with electronic cigarettes (commonly called e-cigarettes). The safety of e-cigarettes is unknown, and some may contain harmful chemicals.  If you relapse, do not give up! Plan ahead and think about what you will do the next time you get the urge to smoke. HOW WILL I FEEL WHEN I QUIT SMOKING? You  may have symptoms of withdrawal because your body is used to nicotine (the addictive substance in cigarettes). You may crave cigarettes, be irritable, feel very hungry, cough often, get headaches, or have difficulty concentrating. The withdrawal symptoms are only temporary. They are strongest when you first quit but will go away within 10-14 days. When withdrawal symptoms occur, stay in control. Think about your reasons for quitting. Remind yourself that these are signs that your body is healing and getting used to being without cigarettes. Remember that withdrawal symptoms are easier to treat than the major diseases that smoking can cause.  Even after the withdrawal is over, expect periodic urges to smoke. However, these cravings are generally short lived and will go away whether you smoke or not. Do not smoke! WHAT RESOURCES ARE AVAILABLE TO HELP ME QUIT SMOKING? Your health care provider can direct you to community resources or hospitals for support, which may include:  Group support.  Education.  Hypnosis.  Therapy.   This information is not intended to replace advice given to you by your health care provider. Make sure you discuss any questions you have with your health care provider.   Document Released: 10/02/2003 Document Revised: 01/24/2014 Document Reviewed: 06/21/2012 Elsevier Interactive Patient Education 2016 [employer]  

## 2014-10-28 NOTE — Progress Notes (Signed)
Subjective:    Patient ID: [name] [name], male    DOB: [birth year], 49 y.o.   MRN: [id number]  Patient here today for CPE- he is doing well today without complaints.  Hyperlipidemia This is a chronic problem. The current episode started more than 1 year ago. The problem is controlled. Recent lipid tests were reviewed and are normal. Pertinent negatives include no myalgias or shortness of breath. Current antihyperlipidemic treatment includes statins. The current treatment provides moderate improvement of lipids. Compliance problems include adherence to diet and adherence to exercise.  Risk factors for coronary artery disease include dyslipidemia, family history, hypertension and obesity.  COPD Has albuterol at home as a rescue but does not ONly  Uses symbicort occasionally- says that he usually feels fine without it- Still smokes Allergic rhinitis Hasn't used flonase in awhile but uses [employer] brand antihistamine daily. Chronic Low back pain Sees dr.Harkins at pain management for pain pills GERD Patient is on nexium and works well for symptoms  * had right otitis media 2 weeks ago and was given an antibiotic - ear still hurting some    Review of Systems  Constitutional: Negative.   HENT: Negative.   Respiratory: Negative.  Negative for shortness of breath.   Cardiovascular: Negative.   Gastrointestinal: Negative.   Genitourinary: Negative.   Musculoskeletal: Negative for myalgias.  Neurological: Negative.   Psychiatric/Behavioral: Negative.   All other systems reviewed and are negative.      Objective:   Physical Exam  Constitutional: He is oriented to person, place, and time. He appears well-developed and well-nourished.  HENT:  Head: Normocephalic.  Right Ear: External ear normal.  Left Ear: External ear normal.  Nose: Nose normal.  Mouth/Throat: Oropharynx is clear and moist.  Eyes: EOM are normal. Pupils are equal, round, and reactive to light.  Neck: Normal range  of motion. Neck supple. No JVD present. No thyromegaly present.  Cardiovascular: Normal rate, regular rhythm, normal heart sounds and intact distal pulses.  Exam reveals no gallop and no friction rub.   No murmur heard. Pulmonary/Chest: Effort normal and breath sounds normal. No respiratory distress. He has no wheezes. He has no rales. He exhibits no tenderness.  Abdominal: Soft. Bowel sounds are normal. He exhibits no mass. There is no tenderness.  Genitourinary:  Refuses prostate check  Musculoskeletal: Normal range of motion. He exhibits no edema.  Lymphadenopathy:    He has no cervical adenopathy.  Neurological: He is alert and oriented to person, place, and time. No cranial nerve deficit.  Skin: Skin is warm and dry.  Psychiatric: He has a normal mood and affect. His behavior is normal. Judgment and thought content normal.    BP 133/89 mmHg  Pulse 67  Temp(Src) 97 F (36.1 C) (Oral)  Ht _0  (1.854 m)  Wt 252 lb (114.306 kg)  BMI 33.25 kg/m2       Assessment & Plan:   1. Gastroesophageal reflux disease without esophagitis Avoid spicy foods Do not eat 2 hours prior to bedtime - esomeprazole (NEXIUM) 40 MG capsule; Take 1 capsule (40 mg total) by mouth daily.  Dispense: 30 capsule; Refill: 5  2. Hyperlipidemia with target LDL less than 100 Low fat diet - atorvastatin (LIPITOR) 40 MG tablet; Take 1 tablet (40 mg total) by mouth daily.  Dispense: 30 tablet; Refill: 5 - CMP14+EGFR - Lipid panel  3. SMOKER Smoking cessation encouraged  4. Chronic back pain Low back strengthening exercises  5. BMI 33.0-33.9,adult Discussed diet and exercise  for person with BMI >25 Will recheck weight in 3-6 months   6. Other seasonal allergic rhinitis - loratadine (CLARITIN) 10 MG tablet; Take 1 tablet (10 mg total) by mouth daily.  Dispense: 30 tablet; Refill: 5  7. Recurrent acute serous otitis media of right ear OTC decongestatnt - amoxicillin (AMOXIL) 875 MG tablet; Take 1  tablet (875 mg total) by mouth 2 (two) times daily. 1 po BID  Dispense: 20 tablet; Refill: 0    Labs pending Health maintenance reviewed Diet and exercise encouraged Continue all meds Follow up  In 3 month   [place], FNP

## 2014-10-29 LAB — CMP14+EGFR
ALT: 22 IU/L (ref 0–44)
AST: 18 IU/L (ref 0–40)
Albumin/Globulin Ratio: 1.4 (ref 1.1–2.5)
Albumin: 4.2 g/dL (ref 3.5–5.5)
Alkaline Phosphatase: 160 IU/L — ABNORMAL HIGH (ref 39–117)
BUN/Creatinine Ratio: 8 — ABNORMAL LOW (ref 9–20)
BUN: 7 mg/dL (ref 6–24)
Bilirubin Total: 0.3 mg/dL (ref 0.0–1.2)
CO2: 23 mmol/L (ref 18–29)
Calcium: 9.1 mg/dL (ref 8.7–10.2)
Chloride: 101 mmol/L (ref 97–108)
Creatinine, Ser: 0.84 mg/dL (ref 0.76–1.27)
GFR calc Af Amer: 121 mL/min/{1.73_m2} (ref >59)
GFR calc non Af Amer: 104 mL/min/{1.73_m2} (ref >59)
Globulin, Total: 2.9 g/dL (ref 1.5–4.5)
Glucose: 96 mg/dL (ref 65–99)
Potassium: 4 mmol/L (ref 3.5–5.2)
Sodium: 141 mmol/L (ref 134–144)
Total Protein: 7.1 g/dL (ref 6.0–8.5)

## 2014-10-29 LAB — LIPID PANEL
Chol/HDL Ratio: 5.7 ratio units — ABNORMAL HIGH (ref 0.0–5.0)
Cholesterol, Total: 164 mg/dL (ref 100–199)
HDL: 29 mg/dL — ABNORMAL LOW (ref >39)
LDL Calculated: 114 mg/dL — ABNORMAL HIGH (ref 0–99)
Triglycerides: 104 mg/dL (ref 0–149)
VLDL Cholesterol Cal: 21 mg/dL (ref 5–40)

## 2014-10-30 DIAGNOSIS — Z23 Encounter for immunization: Secondary | ICD-10-CM

## 2014-11-19 ENCOUNTER — Ambulatory Visit (INDEPENDENT_AMBULATORY_CARE_PROVIDER_SITE_OTHER): Payer: BLUE CROSS/BLUE SHIELD | Admitting: Family Medicine

## 2014-11-19 VITALS — BP 113/70 | HR 82 | Temp 97.7°F | Ht 73.0 in | Wt 250.0 lb

## 2014-11-19 DIAGNOSIS — J201 Acute bronchitis due to Hemophilus influenzae: Secondary | ICD-10-CM

## 2014-11-19 DIAGNOSIS — J0100 Acute maxillary sinusitis, unspecified: Secondary | ICD-10-CM | POA: Diagnosis not present

## 2014-11-19 MED ORDER — AMOXICILLIN-POT CLAVULANATE 875-125 MG PO TABS: 1.0000 | ORAL_TABLET | Freq: Two times a day (BID) | ORAL | Status: DC

## 2014-11-19 MED ORDER — BETAMETHASONE SOD PHOS & ACET 6 (3-3) MG/ML IJ SUSP: 6.0000 mg | Freq: Once | INTRAMUSCULAR | Status: DC

## 2014-11-19 MED ORDER — HYDROCODONE-HOMATROPINE 5-1.5 MG/5ML PO SYRP: 5.0000 mL | ORAL_SOLUTION | Freq: Four times a day (QID) | ORAL | Status: DC | PRN

## 2014-11-19 NOTE — Progress Notes (Signed)
Subjective:  Patient ID: [name] [name], male    DOB: 1965/07/13  Age: 49 y.o. MRN: [id number]  CC: URI   HPI [name] [name] presents for EARS, SINUS, drainage. Spitting up mucus. Cough. Onset 5 days ago. Some dyspnea and wheezing noted. LOng term smoker.   History [name] [name] has a past medical history of DDD (degenerative disc disease); GERD (gastroesophageal reflux disease); Hyperlipidemia; Spinal stenosis; and External hemorrhoid.   He has past surgical history that includes Cholesteatoma excision (1994).   His family history is not on file.He reports that he has been smoking Cigarettes.  He has been smoking about 0.50 packs per day. His smokeless tobacco use includes Snuff and Chew. He reports that he does not drink alcohol or use illicit drugs.  Outpatient Prescriptions Prior to Visit  Medication Sig Dispense Refill  . atorvastatin (LIPITOR) 40 MG tablet Take 1 tablet (40 mg total) by mouth daily. 30 tablet 5  . budesonide-formoterol (SYMBICORT) 160-4.5 MCG/ACT inhaler Inhale 2 puffs into the lungs as needed.    Marland Kitchen esomeprazole (NEXIUM) 40 MG capsule Take 1 capsule (40 mg total) by mouth daily. 30 capsule 5  . HYDROcodone-acetaminophen (NORCO) 10-325 MG per tablet     . loratadine (CLARITIN) 10 MG tablet Take 1 tablet (10 mg total) by mouth daily. 30 tablet 5  . acyclovir ointment (ZOVIRAX) 5 % Apply 1 application topically every 3 (three) hours. (Patient not taking: Reported on 11/19/2014) 30 g 2  . amoxicillin (AMOXIL) 875 MG tablet Take 1 tablet (875 mg total) by mouth 2 (two) times daily. 1 po BID (Patient not taking: Reported on 11/19/2014) 20 tablet 0   No facility-administered medications prior to visit.    ROS Review of Systems  Constitutional: Negative for fever, chills, activity change and appetite change.  HENT: Positive for congestion, postnasal drip, rhinorrhea and sinus pressure. Negative for ear discharge, ear pain, hearing loss, nosebleeds, sneezing and trouble  swallowing.   Respiratory: Positive for cough (some yellow sputum) and shortness of breath. Negative for chest tightness.   Cardiovascular: Negative for chest pain and palpitations.  Skin: Negative for rash.    Objective:  BP 113/70 mmHg  Pulse 82  Temp(Src) 97.7 F (36.5 C) (Oral)  Ht  (1.854 m)  Wt 250 lb (113.399 kg)  BMI 32.99 kg/m2  SpO2 98%  BP Readings from Last 3 Encounters:  11/19/14 113/70  10/28/14 133/89  10/02/14 123/87    Wt Readings from Last 3 Encounters:  11/19/14 250 lb (113.399 kg)  10/28/14 252 lb (114.306 kg)  10/02/14 246 lb 6.4 oz (111.766 kg)     Physical Exam  Constitutional: He appears well-developed and well-nourished.  HENT:  Head: Normocephalic and atraumatic.  Right Ear: Tympanic membrane and external ear normal. No decreased hearing is noted.  Left Ear: Tympanic membrane and external ear normal. No decreased hearing is noted.  Nose: Mucosal edema (with turbinate enlargement and erythema) present. Right sinus exhibits maxillary sinus tenderness. Right sinus exhibits no frontal sinus tenderness. Left sinus exhibits maxillary sinus tenderness. Left sinus exhibits no frontal sinus tenderness.  Mouth/Throat: No oropharyngeal exudate or posterior oropharyngeal erythema.  Neck: Normal range of motion. No Brudzinski's sign noted.  Pulmonary/Chest: No respiratory distress. He has wheezes.  Lymphadenopathy:       Head (right side): No preauricular adenopathy present.       Head (left side): No preauricular adenopathy present.    He has no cervical adenopathy.  Right cervical: No superficial cervical adenopathy present.      Left cervical: No superficial cervical adenopathy present.    No results found for: HGBA1C  Lab Results  Component Value Date   WBC 13.4* 03/25/2013   HGB 15.4 03/25/2013   HCT 49.1 03/25/2013   GLUCOSE 96 10/28/2014   CHOL 164 10/28/2014   TRIG 104 10/28/2014   HDL 29* 10/28/2014   LDLCALC 114* 10/28/2014    ALT 22 10/28/2014   AST 18 10/28/2014   NA 141 10/28/2014   K 4.0 10/28/2014   CL 101 10/28/2014   CREATININE 0.84 10/28/2014   BUN 7 10/28/2014   CO2 23 10/28/2014   PSA 0.5 03/25/2013    Ct Cervical Spine W Contrast  03/28/2011   *RADIOLOGY REPORT* MYELOGRAM  INJECTION Technique:  Informed consent was obtained from the patient prior to the procedure, including potential complications of headache, allergy, infection and pain. Specific instructions were given regarding 24 hour bedrest post procedure to prevent post-LP headache.  A timeout procedure was performed.  With the patient prone, the lower back was prepped with Betadine.  1% Lidocaine was used for local anesthesia.  Lumbar puncture was performed by the radiologist at the L5 - S1 level using a 22 gauge needle with return of clear CSF.  10 ml of Omnipaque-300 was injected into the subarachnoid space . IMPRESSION: Successful injection of  intrathecal contrast for myelography. MYELOGRAM LUMBAR Clinical Data:  Low back pain.  Neck pain. Comparison: MRI cervical spine and lumbar spine 08/31/2004. Findings: Good opacification lumbar subarachnoid space.  Anatomic alignment.  No nerve root cut off or spinal stenosis.  Upright films demonstrate no dynamic instability. Fluoroscopy Time: 55 seconds. IMPRESSION: As above. CT MYELOGRAPHY LUMBAR SPINE Technique: Multidetector CT imaging of the lumbar spine was performed following myelography.  Multiplanar CT image reconstructions were also generated. Findings:  No prevertebral or paraspinous masses.  Conus slightly low ending midbody L2. L1-2: Normal interspace. L2-3: Normal interspace. L3-4: Normal interspace. L4-5: Normal interspace. L5-S1: Mild bulge.  No stenosis or disc protrusion. Compared with prior lumbar MRI, there is good general agreement. IMPRESSION: Mild bulge L5-S1.  No stenosis or disc protrusion. MYELOGRAM CERVICAL Findings: Good opacification of the cervical subarachnoid space. Slight  asymmetric truncation left C4 nerve root.  Otherwise no significant nerve root cut off or spinal stenosis. Normal appearing cord morphology. IMPRESSION: As above. CT MYELOGRAPHY CERVICAL SPINE Technique: Multidetector CT imaging of the cervical spine was performed following myelography.  Multiplanar CT image reconstructions were also generated. The individual disc spaces were examined as follows: C2-3:  Normal. C3-4:  Asymmetric uncinate spurring on the left with mild central annular bulging. No soft disc protrusion.  Left C4 nerve root encroachment likely. C4-5:  Mild bulge. C5-6:  Normal. C6-7:  Normal. C7-T1:  Normal. Clear lung fields.  Airway midline. Compared with prior cervical MRI, there has been slight progression at C3-C4.  IMPRESSION: Asymmetric uncinate spurring on the left with myelographically demonstrable slight left C4 nerve root truncation. No soft disc protrusion.  Correlate clinically. Original Report Authenticated By: Elsie StainJOHN T. CURNES, M.D.  Ct Lumbar Spine W Contrast  03/28/2011   *RADIOLOGY REPORT* MYELOGRAM  INJECTION Technique:  Informed consent was obtained from the patient prior to the procedure, including potential complications of headache, allergy, infection and pain. Specific instructions were given regarding 24 hour bedrest post procedure to prevent post-LP headache.  A timeout procedure was performed.  With the patient prone, the lower back was prepped with  Betadine.  1% Lidocaine was used for local anesthesia.  Lumbar puncture was performed by the radiologist at the L5 - S1 level using a 22 gauge needle with return of clear CSF.  10 ml of Omnipaque-300 was injected into the subarachnoid space . IMPRESSION: Successful injection of  intrathecal contrast for myelography. MYELOGRAM LUMBAR Clinical Data:  Low back pain.  Neck pain. Comparison: MRI cervical spine and lumbar spine 08/31/2004. Findings: Good opacification lumbar subarachnoid space.  Anatomic alignment.  No nerve root cut  off or spinal stenosis.  Upright films demonstrate no dynamic instability. Fluoroscopy Time: 55 seconds. IMPRESSION: As above. CT MYELOGRAPHY LUMBAR SPINE Technique: Multidetector CT imaging of the lumbar spine was performed following myelography.  Multiplanar CT image reconstructions were also generated. Findings:  No prevertebral or paraspinous masses.  Conus slightly low ending midbody L2. L1-2: Normal interspace. L2-3: Normal interspace. L3-4: Normal interspace. L4-5: Normal interspace. L5-S1: Mild bulge.  No stenosis or disc protrusion. Compared with prior lumbar MRI, there is good general agreement. IMPRESSION: Mild bulge L5-S1.  No stenosis or disc protrusion. MYELOGRAM CERVICAL Findings: Good opacification of the cervical subarachnoid space. Slight asymmetric truncation left C4 nerve root.  Otherwise no significant nerve root cut off or spinal stenosis. Normal appearing cord morphology. IMPRESSION: As above. CT MYELOGRAPHY CERVICAL SPINE Technique: Multidetector CT imaging of the cervical spine was performed following myelography.  Multiplanar CT image reconstructions were also generated. The individual disc spaces were examined as follows: C2-3:  Normal. C3-4:  Asymmetric uncinate spurring on the left with mild central annular bulging. No soft disc protrusion.  Left C4 nerve root encroachment likely. C4-5:  Mild bulge. C5-6:  Normal. C6-7:  Normal. C7-T1:  Normal. Clear lung fields.  Airway midline. Compared with prior cervical MRI, there has been slight progression at C3-C4.  IMPRESSION: Asymmetric uncinate spurring on the left with myelographically demonstrable slight left C4 nerve root truncation. No soft disc protrusion.  Correlate clinically. Original Report Authenticated By: Elsie Stain, M.D.  Dg Myelogram 2+ Regions  03/28/2011   *RADIOLOGY REPORT* MYELOGRAM  INJECTION Technique:  Informed consent was obtained from the patient prior to the procedure, including potential complications of  headache, allergy, infection and pain. Specific instructions were given regarding 24 hour bedrest post procedure to prevent post-LP headache.  A timeout procedure was performed.  With the patient prone, the lower back was prepped with Betadine.  1% Lidocaine was used for local anesthesia.  Lumbar puncture was performed by the radiologist at the L5 - S1 level using a 22 gauge needle with return of clear CSF.  10 ml of Omnipaque-300 was injected into the subarachnoid space . IMPRESSION: Successful injection of  intrathecal contrast for myelography. MYELOGRAM LUMBAR Clinical Data:  Low back pain.  Neck pain. Comparison: MRI cervical spine and lumbar spine 08/31/2004. Findings: Good opacification lumbar subarachnoid space.  Anatomic alignment.  No nerve root cut off or spinal stenosis.  Upright films demonstrate no dynamic instability. Fluoroscopy Time: 55 seconds. IMPRESSION: As above. CT MYELOGRAPHY LUMBAR SPINE Technique: Multidetector CT imaging of the lumbar spine was performed following myelography.  Multiplanar CT image reconstructions were also generated. Findings:  No prevertebral or paraspinous masses.  Conus slightly low ending midbody L2. L1-2: Normal interspace. L2-3: Normal interspace. L3-4: Normal interspace. L4-5: Normal interspace. L5-S1: Mild bulge.  No stenosis or disc protrusion. Compared with prior lumbar MRI, there is good general agreement. IMPRESSION: Mild bulge L5-S1.  No stenosis or disc protrusion. MYELOGRAM CERVICAL Findings: Good opacification of  the cervical subarachnoid space. Slight asymmetric truncation left C4 nerve root.  Otherwise no significant nerve root cut off or spinal stenosis. Normal appearing cord morphology. IMPRESSION: As above. CT MYELOGRAPHY CERVICAL SPINE Technique: Multidetector CT imaging of the cervical spine was performed following myelography.  Multiplanar CT image reconstructions were also generated. The individual disc spaces were examined as follows: C2-3:   Normal. C3-4:  Asymmetric uncinate spurring on the left with mild central annular bulging. No soft disc protrusion.  Left C4 nerve root encroachment likely. C4-5:  Mild bulge. C5-6:  Normal. C6-7:  Normal. C7-T1:  Normal. Clear lung fields.  Airway midline. Compared with prior cervical MRI, there has been slight progression at C3-C4.  IMPRESSION: Asymmetric uncinate spurring on the left with myelographically demonstrable slight left C4 nerve root truncation. No soft disc protrusion.  Correlate clinically. Original Report Authenticated By: Elsie Stain, M.D.   Assessment & Plan:   [name] [name] was seen today for uri.  Diagnoses and all orders for this visit:  Acute bronchitis due to Haemophilus influenzae -     betamethasone acetate-betamethasone sodium phosphate (CELESTONE) injection 6 mg; Inject 1 mL (6 mg total) into the muscle once.  Acute maxillary sinusitis, recurrence not specified  Other orders -     HYDROcodone-homatropine (HYCODAN) 5-1.5 MG/5ML syrup; Take 5 mLs by mouth every 6 (six) hours as needed for cough. -     amoxicillin-clavulanate (AUGMENTIN) 875-125 MG tablet; Take 1 tablet by mouth 2 (two) times daily. Take all of this medication   I have discontinued [name] [name] amoxicillin. I am also having him start on HYDROcodone-homatropine and amoxicillin-clavulanate. Additionally, I am having him maintain his HYDROcodone-acetaminophen, acyclovir ointment, budesonide-formoterol, loratadine, esomeprazole, and atorvastatin. We will continue to administer betamethasone acetate-betamethasone sodium phosphate.  Meds ordered this encounter  Medications  . HYDROcodone-homatropine (HYCODAN) 5-1.5 MG/5ML syrup    Sig: Take 5 mLs by mouth every 6 (six) hours as needed for cough.    Dispense:  120 mL    Refill:  0  . amoxicillin-clavulanate (AUGMENTIN) 875-125 MG tablet    Sig: Take 1 tablet by mouth 2 (two) times daily. Take all of this medication    Dispense:  20 tablet    Refill:  0    . betamethasone acetate-betamethasone sodium phosphate (CELESTONE) injection 6 mg    Sig:    Discussed smoking cessation.  Follow-up: Return if symptoms worsen or fail to improve.  Mechele Claude, M.D.

## 2014-11-19 NOTE — Patient Instructions (Signed)
Smoking Cessation, Tips for Success If you are ready to quit smoking, congratulations! You have chosen to help yourself be healthier. Cigarettes bring nicotine, tar, carbon monoxide, and other irritants into your body. Your lungs, heart, and blood vessels will be able to work better without these poisons. There are many different ways to quit smoking. Nicotine gum, nicotine patches, a nicotine inhaler, or nicotine nasal spray can help with physical craving. Hypnosis, support groups, and medicines help break the habit of smoking. WHAT THINGS CAN I DO TO MAKE QUITTING EASIER?  Here are some tips to help you quit for good:  Pick a date when you will quit smoking completely. Tell all of your friends and family about your plan to quit on that date.  Do not try to slowly cut down on the number of cigarettes you are smoking. Pick a quit date and quit smoking completely starting on that day.  Throw away all cigarettes.   Clean and remove all ashtrays from your home, work, and car.  On a card, write down your reasons for quitting. Carry the card with you and read it when you get the urge to smoke.  Cleanse your body of nicotine. Drink enough water and fluids to keep your urine clear or pale yellow. Do this after quitting to flush the nicotine from your body.  Learn to predict your moods. Do not let a bad situation be your excuse to have a cigarette. Some situations in your life might tempt you into wanting a cigarette.  Never have "just one" cigarette. It leads to wanting another and another. Remind yourself of your decision to quit.  Change habits associated with smoking. If you smoked while driving or when feeling stressed, try other activities to replace smoking. Stand up when drinking your coffee. Brush your teeth after eating. Sit in a different chair when you read the paper. Avoid alcohol while trying to quit, and try to drink fewer caffeinated beverages. Alcohol and caffeine may urge you to  smoke.  Avoid foods and drinks that can trigger a desire to smoke, such as sugary or spicy foods and alcohol.  Ask people who smoke not to smoke around you.  Have something planned to do right after eating or having a cup of coffee. For example, plan to take a walk or exercise.  Try a relaxation exercise to calm you down and decrease your stress. Remember, you may be tense and nervous for the first 2 weeks after you quit, but this will pass.  Find new activities to keep your hands busy. Play with a pen, coin, or rubber band. Doodle or draw things on paper.  Brush your teeth right after eating. This will help cut down on the craving for the taste of tobacco after meals. You can also try mouthwash.   Use oral substitutes in place of cigarettes. Try using lemon drops, carrots, cinnamon sticks, or chewing gum. Keep them handy so they are available when you have the urge to smoke.  When you have the urge to smoke, try deep breathing.  Designate your home as a nonsmoking area.  If you are a heavy smoker, ask your health care provider about a prescription for nicotine chewing gum. It can ease your withdrawal from nicotine.  Reward yourself. Set aside the cigarette money you save and buy yourself something nice.  Look for support from others. Join a support group or smoking cessation program. Ask someone at home or at work to help you with your plan   to quit smoking.  Always ask yourself, "Do I need this cigarette or is this just a reflex?" Tell yourself, "Today, I choose not to smoke," or "I do not want to smoke." You are reminding yourself of your decision to quit.  Do not replace cigarette smoking with electronic cigarettes (commonly called e-cigarettes). The safety of e-cigarettes is unknown, and some may contain harmful chemicals.  If you relapse, do not give up! Plan ahead and think about what you will do the next time you get the urge to smoke. HOW WILL I FEEL WHEN I QUIT SMOKING? You  may have symptoms of withdrawal because your body is used to nicotine (the addictive substance in cigarettes). You may crave cigarettes, be irritable, feel very hungry, cough often, get headaches, or have difficulty concentrating. The withdrawal symptoms are only temporary. They are strongest when you first quit but will go away within 10-14 days. When withdrawal symptoms occur, stay in control. Think about your reasons for quitting. Remind yourself that these are signs that your body is healing and getting used to being without cigarettes. Remember that withdrawal symptoms are easier to treat than the major diseases that smoking can cause.  Even after the withdrawal is over, expect periodic urges to smoke. However, these cravings are generally short lived and will go away whether you smoke or not. Do not smoke! WHAT RESOURCES ARE AVAILABLE TO HELP ME QUIT SMOKING? Your health care provider can direct you to community resources or hospitals for support, which may include:  Group support.  Education.  Hypnosis.  Therapy.   This information is not intended to replace advice given to you by your health care provider. Make sure you discuss any questions you have with your health care provider.   Document Released: 10/02/2003 Document Revised: 01/24/2014 Document Reviewed: 06/21/2012 Elsevier Interactive Patient Education 2016 [employer]  

## 2014-11-25 ENCOUNTER — Ambulatory Visit (INDEPENDENT_AMBULATORY_CARE_PROVIDER_SITE_OTHER): Payer: BLUE CROSS/BLUE SHIELD | Admitting: Family

## 2014-11-25 ENCOUNTER — Encounter: Payer: Self-pay | Admitting: Family

## 2014-11-25 VITALS — BP 123/85 | HR 92 | Temp 97.4°F | Ht 73.0 in | Wt 250.2 lb

## 2014-11-25 DIAGNOSIS — J329 Chronic sinusitis, unspecified: Secondary | ICD-10-CM

## 2014-11-25 DIAGNOSIS — J309 Allergic rhinitis, unspecified: Secondary | ICD-10-CM | POA: Diagnosis not present

## 2014-11-25 MED ORDER — LEVOFLOXACIN 500 MG PO TABS: 500.0000 mg | ORAL_TABLET | Freq: Every day | ORAL | Status: DC

## 2014-11-25 MED ORDER — METHYLPREDNISOLONE 4 MG PO TBPK
ORAL_TABLET | ORAL | Status: DC
Start: 2014-11-25 — End: 2015-01-20

## 2014-11-25 MED ORDER — FLUTICASONE PROPIONATE 50 MCG/ACT NA SUSP: 2.0000 | Freq: Every day | NASAL | Status: DC

## 2014-11-25 NOTE — Patient Instructions (Signed)
Sinusitis, Adult Sinusitis is redness, soreness, and inflammation of the paranasal sinuses. Paranasal sinuses are air pockets within the bones of your face. They are located beneath your eyes, in the middle of your forehead, and above your eyes. In healthy paranasal sinuses, mucus is able to drain out, and air is able to circulate through them by way of your nose. However, when your paranasal sinuses are inflamed, mucus and air can become trapped. This can allow bacteria and other germs to grow and cause infection. Sinusitis can develop quickly and last only a short time (acute) or continue over a long period (chronic). Sinusitis that lasts for more than 12 weeks is considered chronic. CAUSES Causes of sinusitis include:  Allergies.  Structural abnormalities, such as displacement of the cartilage that separates your nostrils (deviated septum), which can decrease the air flow through your nose and sinuses and affect sinus drainage.  Functional abnormalities, such as when the small hairs (cilia) that line your sinuses and help remove mucus do not work properly or are not present. SIGNS AND SYMPTOMS Symptoms of acute and chronic sinusitis are the same. The primary symptoms are pain and pressure around the affected sinuses. Other symptoms include:  Upper toothache.  Earache.  Headache.  Bad breath.  Decreased sense of smell and taste.  A cough, which worsens when you are lying flat.  Fatigue.  Fever.  Thick drainage from your nose, which often is green and may contain pus (purulent).  Swelling and warmth over the affected sinuses. DIAGNOSIS Your health care provider will perform a physical exam. During your exam, your health care provider may perform any of the following to help determine if you have acute sinusitis or chronic sinusitis:  Look in your nose for signs of abnormal growths in your nostrils (nasal polyps).  Tap over the affected sinus to check for signs of  infection.  View the inside of your sinuses using an imaging device that has a light attached (endoscope). If your health care provider suspects that you have chronic sinusitis, one or more of the following tests may be recommended:  Allergy tests.  Nasal culture. A sample of mucus is taken from your nose, sent to a lab, and screened for bacteria.  Nasal cytology. A sample of mucus is taken from your nose and examined by your health care provider to determine if your sinusitis is related to an allergy. TREATMENT Most cases of acute sinusitis are related to a viral infection and will resolve on their own within 10 days. Sometimes, medicines are prescribed to help relieve symptoms of both acute and chronic sinusitis. These may include pain medicines, decongestants, nasal steroid sprays, or saline sprays. However, for sinusitis related to a bacterial infection, your health care provider will prescribe antibiotic medicines. These are medicines that will help kill the bacteria causing the infection. Rarely, sinusitis is caused by a fungal infection. In these cases, your health care provider will prescribe antifungal medicine. For some cases of chronic sinusitis, surgery is needed. Generally, these are cases in which sinusitis recurs more than 3 times per year, despite other treatments. HOME CARE INSTRUCTIONS  Drink plenty of water. Water helps thin the mucus so your sinuses can drain more easily.  Use a humidifier.  Inhale steam 3-4 times a day (for example, sit in the bathroom with the shower running).  Apply a warm, moist washcloth to your face 3-4 times a day, or as directed by your health care provider.  Use saline nasal sprays to help   moisten and clean your sinuses.  Take medicines only as directed by your health care provider.  If you were prescribed either an antibiotic or antifungal medicine, finish it all even if you start to feel better. SEEK IMMEDIATE MEDICAL CARE IF:  You have  increasing pain or severe headaches.  You have nausea, vomiting, or drowsiness.  You have swelling around your face.  You have vision problems.  You have a stiff neck.  You have difficulty breathing.   This information is not intended to replace advice given to you by your health care provider. Make sure you discuss any questions you have with your health care provider.   Document Released: 01/03/2005 Document Revised: 01/24/2014 Document Reviewed: 01/18/2011 Elsevier Interactive Patient Education 2016 [employer]  - Take meds as prescribed - Use a cool mist humidifier  -Use saline nose sprays frequently -Saline irrigations of the nose can be very helpful if done frequently.  * 4X daily for 1 week*  * Use of a nettie pot can be helpful with this. Follow directions with this* -Force fluids -For any cough or congestion  Use plain Mucinex- regular strength or max strength is fine   * Children- consult with Pharmacist for dosing -For fever or aces or pains- take tylenol or ibuprofen appropriate for age and weight.  * for fevers greater than 101 orally you may alternate ibuprofen and tylenol every  3 hours. -Throat lozenges if help   Anahlia Iseminger, FNP   

## 2014-11-25 NOTE — Progress Notes (Signed)
Subjective:    Patient ID: [name] [name], male    DOB: [birth year], 49 y.o.   MRN: [id number]  Sinus Problem This is a recurrent problem. The current episode started more than 1 month ago. The maximum temperature recorded prior to his arrival was 100.4 - 100.9 F. His pain is at a severity of 6/10. The pain is moderate. Associated symptoms include congestion, coughing, ear pain, headaches, a hoarse voice, sinus pressure, a sore throat and swollen glands. Pertinent negatives include no shortness of breath or sneezing. Past treatments include acetaminophen, lying down and oral decongestants. The treatment provided mild relief.  URI  Associated symptoms include congestion, coughing, ear pain, headaches, a sore throat and swollen glands. Pertinent negatives include no sneezing.      Review of Systems  Constitutional: Negative.   HENT: Positive for congestion, ear pain, hoarse voice, sinus pressure and sore throat. Negative for sneezing.   Respiratory: Positive for cough. Negative for shortness of breath.   Cardiovascular: Negative.   Gastrointestinal: Negative.   Endocrine: Negative.   Genitourinary: Negative.   Musculoskeletal: Negative.   Neurological: Positive for headaches.  Hematological: Negative.   Psychiatric/Behavioral: Negative.   All other systems reviewed and are negative.      Objective:   Physical Exam  Constitutional: He is oriented to person, place, and time. He appears well-developed and well-nourished. No distress.  HENT:  Head: Normocephalic.  Right Ear: There is tenderness. Tympanic membrane is erythematous. A middle ear effusion is present.  Left Ear: External ear normal.  Nose: Right sinus exhibits maxillary sinus tenderness. Left sinus exhibits maxillary sinus tenderness.  Nasal passage erythemas with mild swelling  Oropharynx erythemas   Eyes: Pupils are equal, round, and reactive to light. Right eye exhibits no discharge. Left eye exhibits no  discharge.  Neck: Normal range of motion. Neck supple. No thyromegaly present.  Cardiovascular: Normal rate, regular rhythm, normal heart sounds and intact distal pulses.   No murmur heard. Pulmonary/Chest: Effort normal and breath sounds normal. No respiratory distress. He has no wheezes.  Abdominal: Soft. Bowel sounds are normal. He exhibits no distension. There is no tenderness.  Musculoskeletal: Normal range of motion. He exhibits no edema or tenderness.  Neurological: He is alert and oriented to person, place, and time. He has normal reflexes. No cranial nerve deficit.  Skin: Skin is warm and dry. No rash noted. No erythema.  Psychiatric: He has a normal mood and affect. His behavior is normal. Judgment and thought content normal.  Vitals reviewed.     BP 123/85 mmHg  Pulse 92  Temp(Src) 97.4 F (36.3 C) (Oral)  Ht  (1.854 m)  Wt 250 lb 3.2 oz (113.49 kg)  BMI 33.02 kg/m2     Assessment & Plan:  1. Allergic rhinitis, unspecified allergic rhinitis type - fluticasone (FLONASE) 50 MCG/ACT nasal spray; Place 2 sprays into both nostrils daily.  Dispense: 16 g; Refill: 6  2. Recurrent sinusitis -- Take meds as prescribed - Use a cool mist humidifier  -Use saline nose sprays frequently -Saline irrigations of the nose can be very helpful if done frequently.  * 4X daily for 1 week*  * Use of a nettie pot can be helpful with this. Follow directions with this* -Force fluids -For any cough or congestion  Use plain Mucinex- regular strength or max strength is fine   * Children- consult with Pharmacist for dosing -For fever or aces or pains- take tylenol or ibuprofen appropriate for age and  weight.  * for fevers greater than 101 orally you may alternate ibuprofen and tylenol every  3 hours. -Throat lozenges if help - levofloxacin (LEVAQUIN) 500 MG tablet; Take 1 tablet (500 mg total) by mouth daily.  Dispense: 7 tablet; Refill: 0 - methylPREDNISolone (MEDROL DOSEPAK) 4 MG TBPK  tablet; Use as directed  Dispense: 21 tablet; Refill: 0  Jannifer Rodneyhristy Hawks, FNP

## 2015-01-20 ENCOUNTER — Ambulatory Visit (INDEPENDENT_AMBULATORY_CARE_PROVIDER_SITE_OTHER): Payer: BLUE CROSS/BLUE SHIELD | Admitting: Nurse Practitioner

## 2015-01-20 ENCOUNTER — Encounter: Payer: Self-pay | Admitting: Nurse Practitioner

## 2015-01-20 VITALS — BP 130/86 | HR 85 | Temp 98.1°F | Ht 73.0 in | Wt 250.0 lb

## 2015-01-20 DIAGNOSIS — Z6833 Body mass index (BMI) 33.0-33.9, adult: Secondary | ICD-10-CM

## 2015-01-20 DIAGNOSIS — F17200 Nicotine dependence, unspecified, uncomplicated: Secondary | ICD-10-CM

## 2015-01-20 DIAGNOSIS — Z0000 Encounter for general adult medical examination without abnormal findings: Secondary | ICD-10-CM

## 2015-01-20 DIAGNOSIS — Z125 Encounter for screening for malignant neoplasm of prostate: Secondary | ICD-10-CM

## 2015-01-20 DIAGNOSIS — M549 Dorsalgia, unspecified: Secondary | ICD-10-CM

## 2015-01-20 DIAGNOSIS — G8929 Other chronic pain: Secondary | ICD-10-CM

## 2015-01-20 DIAGNOSIS — E785 Hyperlipidemia, unspecified: Secondary | ICD-10-CM

## 2015-01-20 DIAGNOSIS — K219 Gastro-esophageal reflux disease without esophagitis: Secondary | ICD-10-CM

## 2015-01-20 NOTE — Patient Instructions (Signed)
Smoking Cessation, Tips for Success If you are ready to quit smoking, congratulations! You have chosen to help yourself be healthier. Cigarettes bring nicotine, tar, carbon monoxide, and other irritants into your body. Your lungs, heart, and blood vessels will be able to work better without these poisons. There are many different ways to quit smoking. Nicotine gum, nicotine patches, a nicotine inhaler, or nicotine nasal spray can help with physical craving. Hypnosis, support groups, and medicines help break the habit of smoking. WHAT THINGS CAN I DO TO MAKE QUITTING EASIER?  Here are some tips to help you quit for good:  Pick a date when you will quit smoking completely. Tell all of your friends and family about your plan to quit on that date.  Do not try to slowly cut down on the number of cigarettes you are smoking. Pick a quit date and quit smoking completely starting on that day.  Throw away all cigarettes.   Clean and remove all ashtrays from your home, work, and car.  On a card, write down your reasons for quitting. Carry the card with you and read it when you get the urge to smoke.  Cleanse your body of nicotine. Drink enough water and fluids to keep your urine clear or pale yellow. Do this after quitting to flush the nicotine from your body.  Learn to predict your moods. Do not let a bad situation be your excuse to have a cigarette. Some situations in your life might tempt you into wanting a cigarette.  Never have "just one" cigarette. It leads to wanting another and another. Remind yourself of your decision to quit.  Change habits associated with smoking. If you smoked while driving or when feeling stressed, try other activities to replace smoking. Stand up when drinking your coffee. Brush your teeth after eating. Sit in a different chair when you read the paper. Avoid alcohol while trying to quit, and try to drink fewer caffeinated beverages. Alcohol and caffeine may urge you to  smoke.  Avoid foods and drinks that can trigger a desire to smoke, such as sugary or spicy foods and alcohol.  Ask people who smoke not to smoke around you.  Have something planned to do right after eating or having a cup of coffee. For example, plan to take a walk or exercise.  Try a relaxation exercise to calm you down and decrease your stress. Remember, you may be tense and nervous for the first 2 weeks after you quit, but this will pass.  Find new activities to keep your hands busy. Play with a pen, coin, or rubber band. Doodle or draw things on paper.  Brush your teeth right after eating. This will help cut down on the craving for the taste of tobacco after meals. You can also try mouthwash.   Use oral substitutes in place of cigarettes. Try using lemon drops, carrots, cinnamon sticks, or chewing gum. Keep them handy so they are available when you have the urge to smoke.  When you have the urge to smoke, try deep breathing.  Designate your home as a nonsmoking area.  If you are a heavy smoker, ask your health care provider about a prescription for nicotine chewing gum. It can ease your withdrawal from nicotine.  Reward yourself. Set aside the cigarette money you save and buy yourself something nice.  Look for support from others. Join a support group or smoking cessation program. Ask someone at home or at work to help you with your plan   to quit smoking.  Always ask yourself, "Do I need this cigarette or is this just a reflex?" Tell yourself, "Today, I choose not to smoke," or "I do not want to smoke." You are reminding yourself of your decision to quit.  Do not replace cigarette smoking with electronic cigarettes (commonly called e-cigarettes). The safety of e-cigarettes is unknown, and some may contain harmful chemicals.  If you relapse, do not give up! Plan ahead and think about what you will do the next time you get the urge to smoke. HOW WILL I FEEL WHEN I QUIT SMOKING? You  may have symptoms of withdrawal because your body is used to nicotine (the addictive substance in cigarettes). You may crave cigarettes, be irritable, feel very hungry, cough often, get headaches, or have difficulty concentrating. The withdrawal symptoms are only temporary. They are strongest when you first quit but will go away within 10-14 days. When withdrawal symptoms occur, stay in control. Think about your reasons for quitting. Remind yourself that these are signs that your body is healing and getting used to being without cigarettes. Remember that withdrawal symptoms are easier to treat than the major diseases that smoking can cause.  Even after the withdrawal is over, expect periodic urges to smoke. However, these cravings are generally short lived and will go away whether you smoke or not. Do not smoke! WHAT RESOURCES ARE AVAILABLE TO HELP ME QUIT SMOKING? Your health care provider can direct you to community resources or hospitals for support, which may include:  Group support.  Education.  Hypnosis.  Therapy.   This information is not intended to replace advice given to you by your health care provider. Make sure you discuss any questions you have with your health care provider.   Document Released: 10/02/2003 Document Revised: 01/24/2014 Document Reviewed: 06/21/2012 Elsevier Interactive Patient Education 2016 [employer]  

## 2015-01-20 NOTE — Progress Notes (Signed)
Subjective:    Patient ID: EMRIK ERHARD, male    DOB: 02/25/1965, 50 y.o.   MRN: [id number]  Patient here today for follow up of chronic medical problems and CPE.   Hyperlipidemia This is a chronic problem. The current episode started more than 1 year ago. The problem is controlled. Recent lipid tests were reviewed and are normal. Pertinent negatives include no myalgias or shortness of breath. Current antihyperlipidemic treatment includes statins. The current treatment provides moderate improvement of lipids. Compliance problems include adherence to diet and adherence to exercise.  Risk factors for coronary artery disease include dyslipidemia, family history, hypertension and obesity.  COPD Has albuterol at home as a rescue but does not ONly  Uses symbicort occasionally- says that he usually feels fine without it- Still smokes Allergic rhinitis Hasn't used flonase in awhile but uses [facility] brand antihistamine daily. Chronic Low back pain Sees dr.Harkins at [facility] for pain pills GERD Patient is on nexium and works well for symptoms     Review of Systems  Constitutional: Negative.   HENT: Negative.   Respiratory: Negative.  Negative for shortness of breath.   Cardiovascular: Negative.   Gastrointestinal: Negative.   Genitourinary: Negative.   Musculoskeletal: Negative for myalgias.  Neurological: Negative.   Psychiatric/Behavioral: Negative.   All other systems reviewed and are negative.      Objective:   Physical Exam  Constitutional: He is oriented to person, place, and time. He appears well-developed and well-nourished.  HENT:  Head: Normocephalic.  Right Ear: External ear normal.  Left Ear: External ear normal.  Nose: Nose normal.  Mouth/Throat: Oropharynx is clear and moist.  Eyes: EOM are normal. Pupils are equal, round, and reactive to light.  Neck: Normal range of motion. Neck supple. No JVD present. No thyromegaly present.  Cardiovascular: Normal  rate, regular rhythm, normal heart sounds and intact distal pulses.  Exam reveals no gallop and no friction rub.   No murmur heard. Pulmonary/Chest: Effort normal and breath sounds normal. No respiratory distress. He has no wheezes. He has no rales. He exhibits no tenderness.  Abdominal: Soft. Bowel sounds are normal. He exhibits no mass. There is no tenderness.  Genitourinary: Prostate normal and penis normal.  Refuses prostate check  Musculoskeletal: Normal range of motion. He exhibits no edema.  Lymphadenopathy:    He has no cervical adenopathy.  Neurological: He is alert and oriented to person, place, and time. No cranial nerve deficit.  Skin: Skin is warm and dry.  Psychiatric: He has a normal mood and affect. His behavior is normal. Judgment and thought content normal.    BP 130/86 mmHg  Pulse 85  Temp(Src) 98.1 F (36.7 C) (Oral)  Ht _0  (1.854 m)  Wt 250 lb (113.399 kg)  BMI 32.99 kg/m2        Assessment & Plan:  1. Annual physical exam - CBC with Differential/Platelet  2. Gastroesophageal reflux disease without esophagitis Avoid spicy foods Do not eat 2 hours prior to bedtime   3. Hyperlipidemia with target LDL less than 100 Low fat diet - CMP14+EGFR - Lipid panel  4. SMOKER Smoking cessation encourgared  5. Chronic back pain No heavy  Lifting Moist heat  6. BMI 33.0-33.9,adult Discussed diet and exercise for person with BMI >25 Will recheck weight in 3-6 months  7. Prostate cancer screening - PSA, total and free    Labs pending Health maintenance reviewed Diet and exercise encouraged Continue all meds Follow up  In 3 month  Mary-Margaret Hassell Done, FNP

## 2015-01-21 LAB — CMP14+EGFR
ALT: 24 [IU]/L (ref 0–44)
AST: 18 [IU]/L (ref 0–40)
Albumin/Globulin Ratio: 1.5 (ref 1.1–2.5)
Albumin: 4.2 g/dL (ref 3.5–5.5)
Alkaline Phosphatase: 145 [IU]/L — ABNORMAL HIGH (ref 39–117)
BUN/Creatinine Ratio: 13 (ref 9–20)
BUN: 11 mg/dL (ref 6–24)
Bilirubin Total: 0.4 mg/dL (ref 0.0–1.2)
CO2: 24 mmol/L (ref 18–29)
Calcium: 9.6 mg/dL (ref 8.7–10.2)
Chloride: 100 mmol/L (ref 96–106)
Creatinine, Ser: 0.83 mg/dL (ref 0.76–1.27)
GFR calc Af Amer: 121 mL/min/{1.73_m2}
GFR calc non Af Amer: 105 mL/min/{1.73_m2}
Globulin, Total: 2.8 g/dL (ref 1.5–4.5)
Glucose: 99 mg/dL (ref 65–99)
Potassium: 4.6 mmol/L (ref 3.5–5.2)
Sodium: 141 mmol/L (ref 134–144)
Total Protein: 7 g/dL (ref 6.0–8.5)

## 2015-01-21 LAB — LIPID PANEL
Chol/HDL Ratio: 5.7 ratio units — ABNORMAL HIGH (ref 0.0–5.0)
Cholesterol, Total: 199 mg/dL (ref 100–199)
HDL: 35 mg/dL — ABNORMAL LOW (ref >39)
LDL Calculated: 141 mg/dL — ABNORMAL HIGH (ref 0–99)
Triglycerides: 114 mg/dL (ref 0–149)
VLDL Cholesterol Cal: 23 mg/dL (ref 5–40)

## 2015-01-21 LAB — CBC WITH DIFFERENTIAL/PLATELET
Basophils Absolute: 0.1 10*3/uL (ref 0.0–0.2)
Basos: 1 %
EOS (ABSOLUTE): 0.2 10*3/uL (ref 0.0–0.4)
Eos: 2 %
Hematocrit: 47.1 % (ref 37.5–51.0)
Hemoglobin: 16.1 g/dL (ref 12.6–17.7)
Immature Grans (Abs): 0.1 10*3/uL (ref 0.0–0.1)
Immature Granulocytes: 1 %
Lymphocytes Absolute: 3.5 10*3/uL — ABNORMAL HIGH (ref 0.7–3.1)
Lymphs: 30 %
MCH: 32.9 pg (ref 26.6–33.0)
MCHC: 34.2 g/dL (ref 31.5–35.7)
MCV: 96 fL (ref 79–97)
Monocytes Absolute: 0.8 10*3/uL (ref 0.1–0.9)
Monocytes: 7 %
Neutrophils Absolute: 6.9 10*3/uL (ref 1.4–7.0)
Neutrophils: 59 %
Platelets: 275 10*3/uL (ref 150–379)
RBC: 4.89 x10E6/uL (ref 4.14–5.80)
RDW: 13.5 % (ref 12.3–15.4)
WBC: 11.6 10*3/uL — ABNORMAL HIGH (ref 3.4–10.8)

## 2015-01-21 LAB — PSA, TOTAL AND FREE
PSA, Free Pct: 17.5 %
PSA, Free: 0.21 ng/mL
Prostate Specific Ag, Serum: 1.2 ng/mL (ref 0.0–4.0)

## 2015-02-14 IMAGING — CR DG CHEST 2V
2 series · 2 of 2 positions shown · non-contrast
Comparison: None.

CLINICAL DATA: Physical examination; smoking history

EXAM:
CHEST  2 VIEW

[view not recorded (1 of 2)]
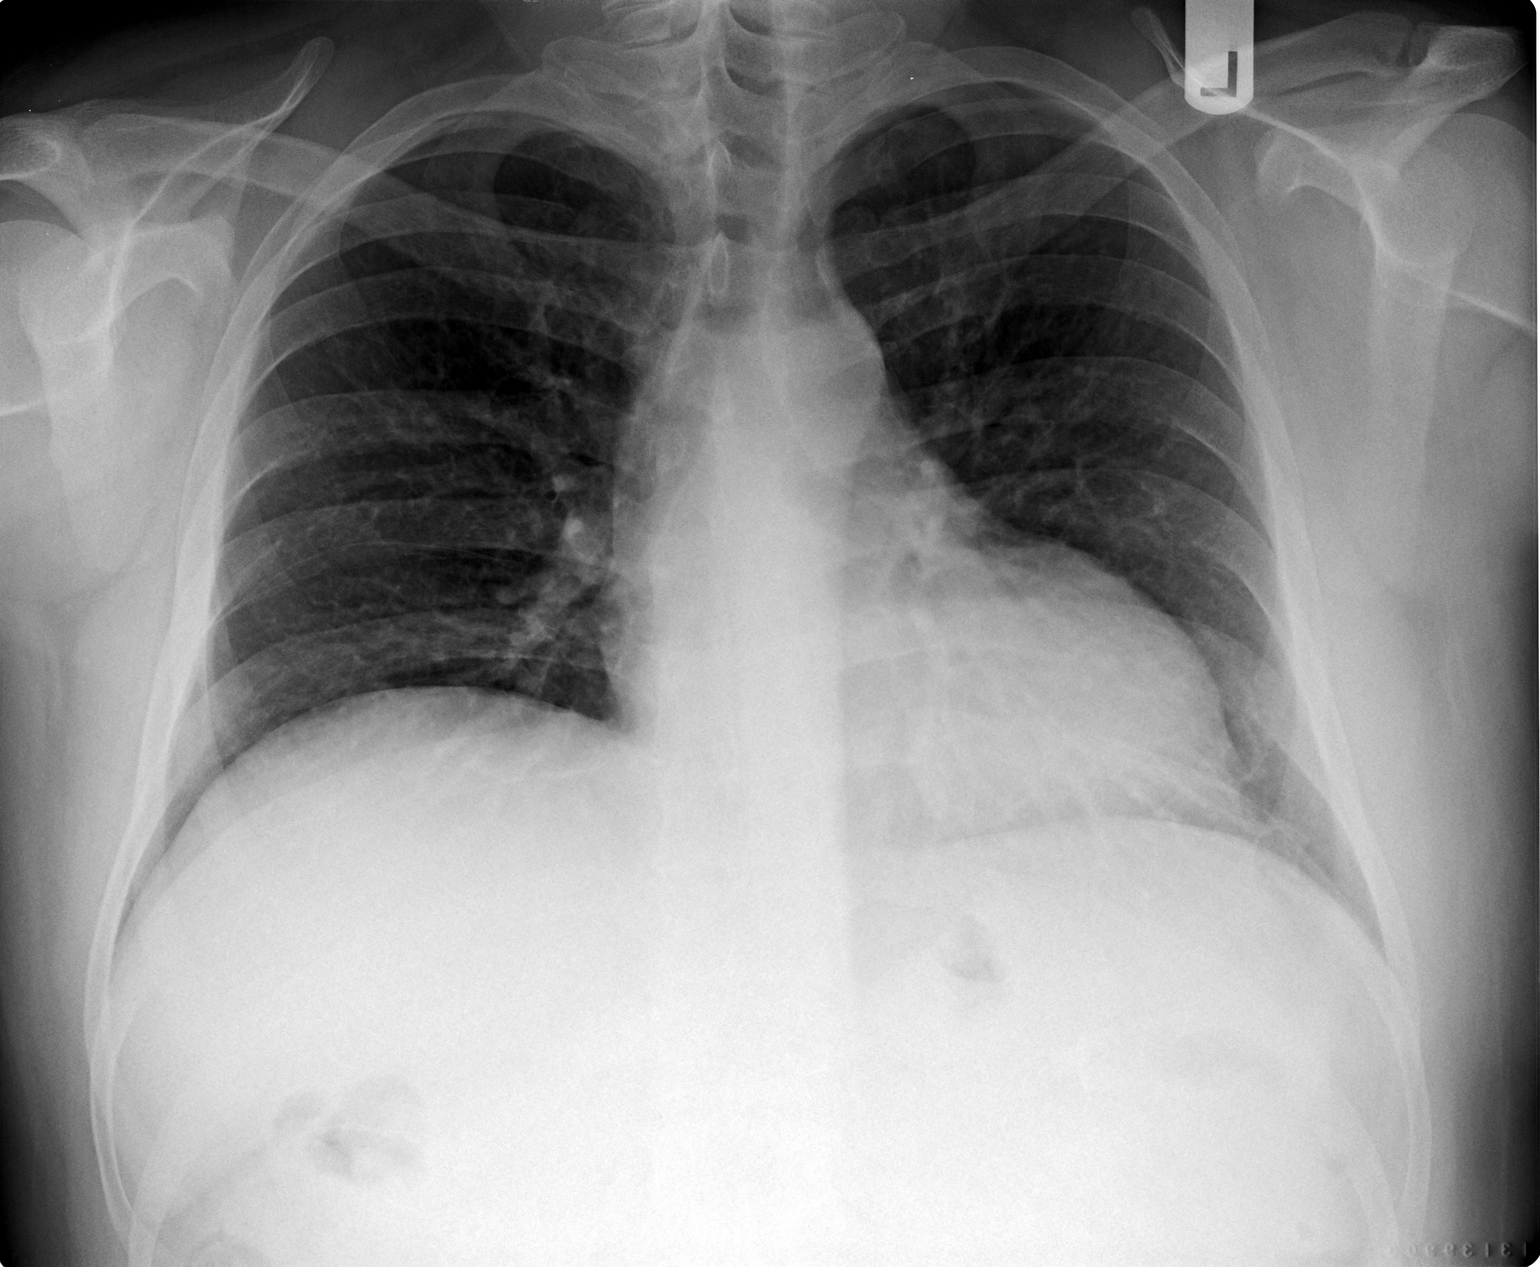

[view not recorded (2 of 2)]
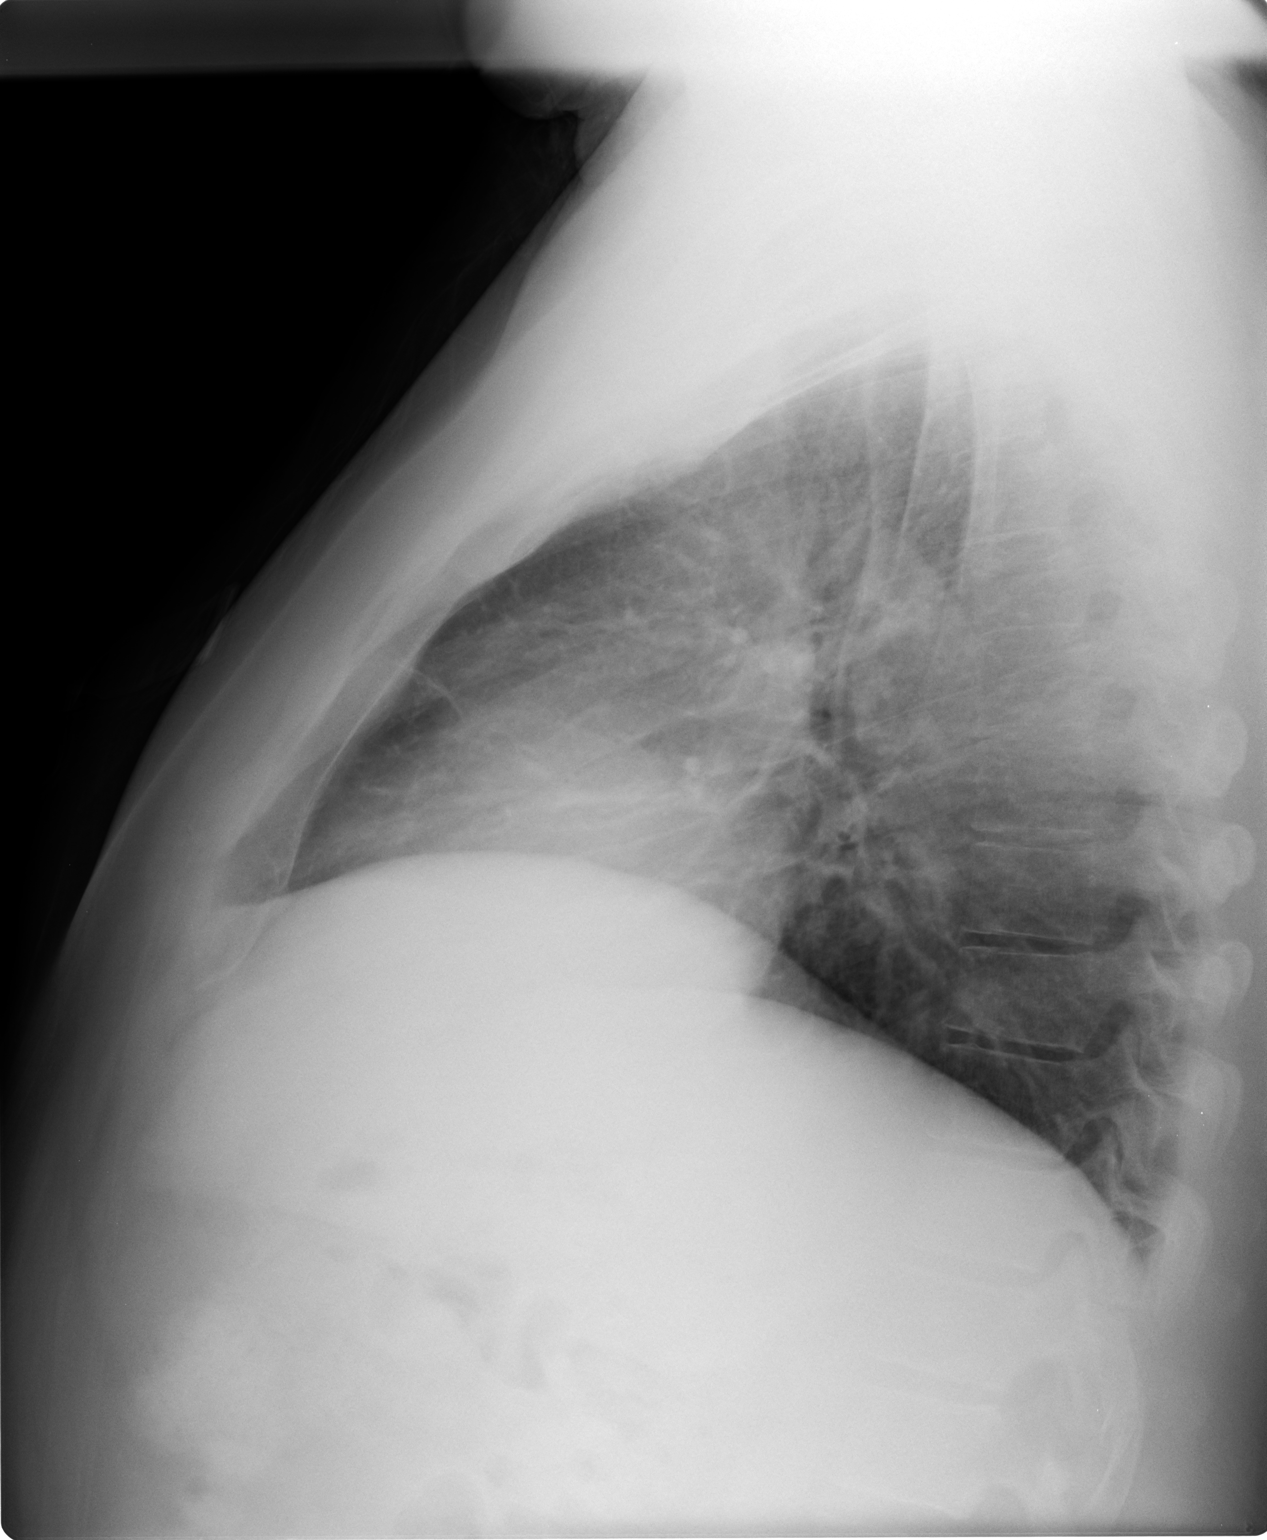

[2 of 2 positions shown; findings below may reference images not displayed]

FINDINGS: There is minimal scarring in the right middle lobe. Lungs are
otherwise clear. Heart is upper normal in size with normal pulmonary
vascularity. No adenopathy. No bone lesions.
IMPRESSION: No edema or consolidation.  Minimal scarring right middle lobe.

## 2015-02-27 ENCOUNTER — Other Ambulatory Visit: Payer: Self-pay | Admitting: Nurse Practitioner

## 2015-06-02 DIAGNOSIS — M4727 Other spondylosis with radiculopathy, lumbosacral region: Secondary | ICD-10-CM | POA: Diagnosis not present

## 2015-06-02 DIAGNOSIS — M5416 Radiculopathy, lumbar region: Secondary | ICD-10-CM | POA: Diagnosis not present

## 2015-06-02 DIAGNOSIS — M5440 Lumbago with sciatica, unspecified side: Secondary | ICD-10-CM | POA: Diagnosis not present

## 2015-06-08 ENCOUNTER — Encounter: Payer: Self-pay | Admitting: Physician Assistant

## 2015-06-08 ENCOUNTER — Ambulatory Visit (INDEPENDENT_AMBULATORY_CARE_PROVIDER_SITE_OTHER): Payer: BLUE CROSS/BLUE SHIELD | Admitting: Physician Assistant

## 2015-06-08 VITALS — BP 129/84 | HR 100 | Temp 98.0°F | Ht 73.0 in | Wt 255.2 lb

## 2015-06-08 DIAGNOSIS — J208 Acute bronchitis due to other specified organisms: Secondary | ICD-10-CM

## 2015-06-08 MED ORDER — AZITHROMYCIN 250 MG PO TABS: ORAL_TABLET | ORAL | Status: DC

## 2015-06-08 MED ORDER — METHYLPREDNISOLONE ACETATE 80 MG/ML IJ SUSP
80.0000 mg | Freq: Once | INTRAMUSCULAR | Status: AC
  Administered 2015-06-08: 80 mg via INTRAMUSCULAR

## 2015-06-08 MED ORDER — BENZONATATE 100 MG PO CAPS: 100.0000 mg | ORAL_CAPSULE | Freq: Two times a day (BID) | ORAL | Status: DC | PRN

## 2015-06-08 NOTE — Patient Instructions (Signed)

## 2015-06-08 NOTE — Progress Notes (Signed)
Subjective:     Patient ID: Renda RollsJeremy T Spivey, male   DOB: 08/07/1965, 50 y.o.   MRN: [id number]  HPI Pt with cough, congestion, wheezing x 1 week Using OTC meds and Alb Inh  Review of Systems  Constitutional: Negative.   HENT: Positive for congestion, postnasal drip, rhinorrhea and sinus pressure. Negative for sneezing and sore throat.   Respiratory: Positive for cough and wheezing. Negative for chest tightness and shortness of breath.   Cardiovascular: Negative.        Objective:   Physical Exam  Constitutional: He appears well-developed and well-nourished.  HENT:  Right Ear: External ear normal.  Left Ear: External ear normal.  Mouth/Throat: Oropharynx is clear and moist. No oropharyngeal exudate.  Cardiovascular: Normal rate, regular rhythm and normal heart sounds.   No murmur heard. Pulmonary/Chest: Effort normal. No respiratory distress. He has wheezes. He has no rales.  Lymphadenopathy:    He has no cervical adenopathy.  Nursing note and vitals reviewed.      Assessment:     1. Acute bronchitis due to other specified organisms        Plan:     Fluids  Rest Alb inhaler qid x 2 days and wean DepoMedrol 80 in office today Zpack Tessalon Perles Stop smoking F/U prn

## 2015-06-25 ENCOUNTER — Other Ambulatory Visit: Payer: Self-pay | Admitting: Nurse Practitioner

## 2015-08-22 ENCOUNTER — Encounter: Payer: Self-pay | Admitting: Nurse Practitioner

## 2015-08-22 ENCOUNTER — Ambulatory Visit (INDEPENDENT_AMBULATORY_CARE_PROVIDER_SITE_OTHER): Payer: BLUE CROSS/BLUE SHIELD | Admitting: Nurse Practitioner

## 2015-08-22 VITALS — BP 121/88 | HR 81 | Temp 97.3°F | Ht 73.0 in | Wt 248.0 lb

## 2015-08-22 DIAGNOSIS — H6981 Other specified disorders of Eustachian tube, right ear: Secondary | ICD-10-CM

## 2015-08-22 MED ORDER — FLUTICASONE PROPIONATE 50 MCG/ACT NA SUSP: 2.0000 | Freq: Every day | NASAL | 6 refills | Status: DC

## 2015-08-22 NOTE — Progress Notes (Signed)
   Subjective:    Patient ID: [name] [name], male    DOB: [birth year], 50 y.o.   MRN: [id number]  HPI Patient comes in today c/o decreased hearing in right ear- started couple weeks ago [place]. He said that his ears never would pop [place]    Review of Systems  Constitutional: Negative for fever.  HENT: Negative for congestion, ear pain and sinus pressure.   Respiratory: Negative for cough.   Cardiovascular: Negative.   Gastrointestinal: Negative.   Genitourinary: Negative.   Neurological: Negative.   Psychiatric/Behavioral: Negative.   All other systems reviewed and are negative.      Objective:   Physical Exam  Constitutional: He is oriented to person, place, and time. He appears well-developed and well-nourished. No distress.  HENT:  Right Ear: Hearing, tympanic membrane, external ear and ear canal normal.  Left Ear: Hearing, tympanic membrane, external ear and ear canal normal.  Nose: Mucosal edema present.  Mouth/Throat: Oropharynx is clear and moist and mucous membranes are normal.  Neck: Normal range of motion. Neck supple.  Cardiovascular: Normal rate, regular rhythm and normal heart sounds.   Pulmonary/Chest: Effort normal and breath sounds normal.  Lymphadenopathy:    He has no cervical adenopathy.  Neurological: He is alert and oriented to person, place, and time.  Skin: Skin is warm.  Psychiatric: He has a normal mood and affect. His behavior is normal. Judgment and thought content normal.    BP 121/88   Pulse 81   Temp 97.3 F (36.3 C) (Oral)   Ht 6\' 1"  (1.854 m)   Wt 248 lb (112.5 kg)   BMI 32.72 kg/m        Assessment & Plan:   1. Eustachian tube dysfunction, right    flonase and OTC decongestant fro several days to open up eustachian tube Force fluids If not improving will need to see ENT  [name], FNP

## 2015-08-22 NOTE — Patient Instructions (Signed)

## 2015-08-25 ENCOUNTER — Telehealth: Payer: Self-pay

## 2015-08-25 NOTE — Telephone Encounter (Signed)
Left detailed message for pt 

## 2015-08-25 NOTE — Telephone Encounter (Signed)
Will have to buy flonase OTC- insurance will not pay for it.

## 2015-08-28 ENCOUNTER — Encounter: Payer: Self-pay | Admitting: Family

## 2015-08-28 ENCOUNTER — Ambulatory Visit (INDEPENDENT_AMBULATORY_CARE_PROVIDER_SITE_OTHER): Payer: BLUE CROSS/BLUE SHIELD

## 2015-08-28 ENCOUNTER — Ambulatory Visit (INDEPENDENT_AMBULATORY_CARE_PROVIDER_SITE_OTHER): Payer: BLUE CROSS/BLUE SHIELD | Admitting: Family

## 2015-08-28 VITALS — BP 124/82 | HR 88 | Temp 98.2°F | Ht 73.0 in | Wt 250.0 lb

## 2015-08-28 DIAGNOSIS — W11XXXA Fall on and from ladder, initial encounter: Secondary | ICD-10-CM | POA: Diagnosis not present

## 2015-08-28 DIAGNOSIS — S7002XA Contusion of left hip, initial encounter: Secondary | ICD-10-CM | POA: Diagnosis not present

## 2015-08-28 DIAGNOSIS — M25511 Pain in right shoulder: Secondary | ICD-10-CM | POA: Diagnosis not present

## 2015-08-28 MED ORDER — NAPROXEN 500 MG PO TABS: 500.0000 mg | ORAL_TABLET | Freq: Two times a day (BID) | ORAL | 1 refills | Status: DC

## 2015-08-28 NOTE — Progress Notes (Signed)
   Subjective:    Patient ID: [name] RollsJeremy T [name], male    DOB: 12/24/1965, 50 y.o.   MRN: [id number]  Fall  The accident occurred 6 to 12 hours ago. The fall occurred from a ladder. He fell from a height of 6 to 10 ft. He landed on concrete. There was no blood loss. The point of impact was the left shoulder, buttocks and right shoulder. The pain is present in the right shoulder and buttocks. The pain is at a severity of 7/10. The pain is moderate. The symptoms are aggravated by rotation. Pertinent negatives include no abdominal pain, bowel incontinence, fever, headaches, hematuria, loss of consciousness, tingling, visual change or vomiting. He has tried rest (Pain medication) for the symptoms. The treatment provided mild relief.      Review of Systems  Constitutional: Negative for fever.  Gastrointestinal: Negative for abdominal pain, bowel incontinence and vomiting.  Genitourinary: Negative for hematuria.  Musculoskeletal: Positive for arthralgias, back pain, joint swelling, myalgias and neck pain.  Neurological: Negative for tingling, loss of consciousness and headaches.       Objective:   Physical Exam  Constitutional: He is oriented to person, place, and time. He appears well-developed and well-nourished. No distress.  HENT:  Head: Normocephalic.  Eyes: Pupils are equal, round, and reactive to light. Right eye exhibits no discharge. Left eye exhibits no discharge.  Neck: Normal range of motion. Neck supple. No thyromegaly present.  Cardiovascular: Normal rate, regular rhythm, normal heart sounds and intact distal pulses.   No murmur heard. Pulmonary/Chest: Effort normal and breath sounds normal. No respiratory distress. He has no wheezes.  Abdominal: Soft. Bowel sounds are normal. He exhibits no distension. There is no tenderness.  Musculoskeletal: He exhibits edema and tenderness.  PT unable to lift right arm greater than 45 degrees without pain. Pt has pain with extension and  flexion  Neurological: He is alert and oriented to person, place, and time.  Skin: Skin is warm and dry. No rash noted. No erythema.  ecchymosis on left hip and back   Psychiatric: He has a normal mood and affect. His behavior is normal. Judgment and thought content normal.  Vitals reviewed.   BP 124/82   Pulse 88   Temp 98.2 F (36.8 C) (Oral)   Ht 6\' 1"  (1.854 m)   Wt 250 lb (113.4 kg)   BMI 32.98 kg/m    Shoulder x-ray- WNL Preliminary reading by Jannifer Rodneyhristy Alizandra Loh, FNP [facility]     Assessment & Plan:  1. Fall from ladder, initial encounter - DG Shoulder Right; Future - naproxen (NAPROSYN) 500 MG tablet; Take 1 tablet (500 mg total) by mouth 2 (two) times daily with a meal.  Dispense: 60 tablet; Refill: 1  2. Right shoulder pain - DG Shoulder Right; Future - naproxen (NAPROSYN) 500 MG tablet; Take 1 tablet (500 mg total) by mouth 2 (two) times daily with a meal.  Dispense: 60 tablet; Refill: 1  3. Contusion, hip, left, initial encounter - naproxen (NAPROSYN) 500 MG tablet; Take 1 tablet (500 mg total) by mouth 2 (two) times daily with a meal.  Dispense: 60 tablet; Refill: 1  Rest Ice Take Naprosyn with food BID for next 5-7 days RTO in 1 week if not improved or ROM continues to be decreased  Jannifer Rodneyhristy Genelda Roark, FNP   Jannifer Rodneyhristy Lamonta Cypress, FNP

## 2015-08-28 NOTE — Patient Instructions (Signed)

## 2015-09-01 DIAGNOSIS — M545 Low back pain: Secondary | ICD-10-CM | POA: Diagnosis not present

## 2015-09-01 DIAGNOSIS — M5440 Lumbago with sciatica, unspecified side: Secondary | ICD-10-CM | POA: Diagnosis not present

## 2015-09-21 ENCOUNTER — Other Ambulatory Visit: Payer: Self-pay | Admitting: Nurse Practitioner

## 2015-09-22 NOTE — Telephone Encounter (Signed)
Last refill without being seen Needs labs 

## 2015-11-05 ENCOUNTER — Other Ambulatory Visit: Payer: Self-pay | Admitting: Family

## 2015-11-05 ENCOUNTER — Other Ambulatory Visit: Payer: Self-pay | Admitting: Nurse Practitioner

## 2015-11-05 DIAGNOSIS — M25511 Pain in right shoulder: Secondary | ICD-10-CM

## 2015-11-05 DIAGNOSIS — S7002XA Contusion of left hip, initial encounter: Secondary | ICD-10-CM

## 2015-11-05 DIAGNOSIS — W11XXXA Fall on and from ladder, initial encounter: Secondary | ICD-10-CM

## 2015-11-05 NOTE — Telephone Encounter (Signed)
Patient NTBS for follow up and lab work  

## 2015-11-06 NOTE — Telephone Encounter (Signed)
appt seen in chart sched for 10/26

## 2015-11-12 ENCOUNTER — Ambulatory Visit (INDEPENDENT_AMBULATORY_CARE_PROVIDER_SITE_OTHER): Payer: BLUE CROSS/BLUE SHIELD | Admitting: Nurse Practitioner

## 2015-11-12 ENCOUNTER — Encounter: Payer: Self-pay | Admitting: Nurse Practitioner

## 2015-11-12 VITALS — BP 132/90 | HR 83 | Temp 98.0°F | Ht 73.0 in | Wt 253.0 lb

## 2015-11-12 DIAGNOSIS — G8929 Other chronic pain: Secondary | ICD-10-CM

## 2015-11-12 DIAGNOSIS — E785 Hyperlipidemia, unspecified: Secondary | ICD-10-CM

## 2015-11-12 DIAGNOSIS — K219 Gastro-esophageal reflux disease without esophagitis: Secondary | ICD-10-CM

## 2015-11-12 DIAGNOSIS — M25511 Pain in right shoulder: Secondary | ICD-10-CM | POA: Diagnosis not present

## 2015-11-12 DIAGNOSIS — M549 Dorsalgia, unspecified: Secondary | ICD-10-CM | POA: Diagnosis not present

## 2015-11-12 MED ORDER — ATORVASTATIN CALCIUM 40 MG PO TABS: 40.0000 mg | ORAL_TABLET | Freq: Every day | ORAL | 5 refills | Status: DC

## 2015-11-12 MED ORDER — NEXIUM 40 MG PO CPDR: 40.0000 mg | DELAYED_RELEASE_CAPSULE | Freq: Every day | ORAL | 5 refills | Status: DC

## 2015-11-12 NOTE — Progress Notes (Signed)
Subjective:    Patient ID: VIDUR KNUST, male    DOB: 11-Apr-1965, 50 y.o.   MRN: [id number]  Patient here today for follow up of chronic medical problems and to get his medications refilled. In addition, the patient reports that his right shoulder is still bothering him from a fall on 8/11. The pain is sharp and burning with any movement. He rates the pain 2-3/10 with normal use and 8-9/10 when lifting or moving objects at work. He has been taking Norco 10-325 for the pain, which he states brings the pain to a tolerable level.    Hyperlipidemia  This is a chronic problem. The current episode started more than 1 year ago. The problem is controlled. Recent lipid tests were reviewed and are normal. Exacerbating diseases include obesity. Pertinent negatives include no myalgias or shortness of breath. Current antihyperlipidemic treatment includes statins. The current treatment provides moderate improvement of lipids. Compliance problems include adherence to diet and adherence to exercise.  Risk factors for coronary artery disease include dyslipidemia, family history, hypertension, obesity and male sex.  COPD Uses Symbicort PRN. Smoker. Allergic rhinitis Hasn't used flonase in awhile but uses [facility] brand antihistamine daily. Chronic Low back pain Sees Dr.Harkins at pain management for pain pills. GERD Patient is on nexium and works well for symptoms   Review of Systems  Constitutional: Negative.   HENT: Negative.   Respiratory: Negative.  Negative for shortness of breath.   Cardiovascular: Negative.   Gastrointestinal: Negative.   Genitourinary: Negative.   Musculoskeletal: Negative for myalgias.       Right shoulder pain.  Neurological: Negative.   Psychiatric/Behavioral: Negative.   All other systems reviewed and are negative.      Objective:   Physical Exam  Constitutional: He is oriented to person, place, and time. He appears well-developed and well-nourished.  HENT:  Head:  Normocephalic.  Right Ear: External ear normal.  Left Ear: External ear normal.  Nose: Nose normal.  Mouth/Throat: Oropharynx is clear and moist.  Eyes: EOM are normal. Pupils are equal, round, and reactive to light.  Neck: Normal range of motion. Neck supple. No JVD present. No thyromegaly present.  Cardiovascular: Normal rate, regular rhythm, normal heart sounds and intact distal pulses.  Exam reveals no gallop and no friction rub.   No murmur heard. Pulmonary/Chest: Effort normal and breath sounds normal. No respiratory distress. He has no wheezes. He has no rales. He exhibits no tenderness.  Abdominal: Soft. Bowel sounds are normal. He exhibits no mass. There is no tenderness.  Musculoskeletal: Normal range of motion. He exhibits no edema.  Full ROM of right shoulder with pain on full abduction. No point tenderness. Grips bilaterally equal.  Lymphadenopathy:    He has no cervical adenopathy.  Neurological: He is alert and oriented to person, place, and time. He has normal reflexes. No cranial nerve deficit.  Skin: Skin is warm and dry.  Psychiatric: He has a normal mood and affect. His behavior is normal. Judgment and thought content normal.    BP 132/90   Pulse 83   Temp 98 F (36.7 C) (Oral)   Ht '6\' 1"'  (1.854 m)   Wt 253 lb (114.8 kg)   BMI 33.38 kg/m      Assessment & Plan:  1. Gastroesophageal reflux disease without esophagitis Avoid spicy foods Do not eat 2 hours prior to bedtime - NEXIUM 40 MG capsule; Take 1 capsule (40 mg total) by mouth daily.  Dispense: 30 capsule; Refill: 5  2. Hyperlipidemia with target LDL less than 100 Avoid fatty foods. - atorvastatin (LIPITOR) 40 MG tablet; Take 1 tablet (40 mg total) by mouth daily.  Dispense: 30 tablet; Refill: 5 - CMP14+EGFR - Lipid panel  3. Chronic back pain, unspecified back location, unspecified back pain laterality Continue to follow-up with Dr. Maryjean Ka.  4. Pain in joint of right shoulder - Ambulatory  referral to Orthopedic Surgery    Labs pending Health maintenance reviewed Diet and exercise encouraged Continue all meds Follow up in 3 months   Hendricks Limes, BSN-RN/FNP student  [place], [place]

## 2015-11-12 NOTE — Patient Instructions (Signed)

## 2015-11-13 LAB — LIPID PANEL
Chol/HDL Ratio: 4.5 ratio units (ref 0.0–5.0)
Cholesterol, Total: 163 mg/dL (ref 100–199)
HDL: 36 mg/dL — ABNORMAL LOW (ref >39)
LDL Calculated: 110 mg/dL — ABNORMAL HIGH (ref 0–99)
Triglycerides: 85 mg/dL (ref 0–149)
VLDL Cholesterol Cal: 17 mg/dL (ref 5–40)

## 2015-11-13 LAB — CMP14+EGFR
ALT: 28 IU/L (ref 0–44)
AST: 24 IU/L (ref 0–40)
Albumin/Globulin Ratio: 1.4 (ref 1.2–2.2)
Albumin: 4.1 g/dL (ref 3.5–5.5)
Alkaline Phosphatase: 134 IU/L — ABNORMAL HIGH (ref 39–117)
BUN/Creatinine Ratio: 16 (ref 9–20)
BUN: 12 mg/dL (ref 6–24)
Bilirubin Total: 0.7 mg/dL (ref 0.0–1.2)
CO2: 26 mmol/L (ref 18–29)
Calcium: 9.1 mg/dL (ref 8.7–10.2)
Chloride: 100 mmol/L (ref 96–106)
Creatinine, Ser: 0.76 mg/dL (ref 0.76–1.27)
GFR calc Af Amer: 125 mL/min/{1.73_m2} (ref >59)
GFR calc non Af Amer: 108 mL/min/{1.73_m2} (ref >59)
Globulin, Total: 2.9 g/dL (ref 1.5–4.5)
Glucose: 99 mg/dL (ref 65–99)
Potassium: 3.8 mmol/L (ref 3.5–5.2)
Sodium: 139 mmol/L (ref 134–144)
Total Protein: 7 g/dL (ref 6.0–8.5)

## 2015-11-17 ENCOUNTER — Telehealth: Payer: Self-pay | Admitting: Nurse Practitioner

## 2015-11-18 DIAGNOSIS — M48062 Spinal stenosis, lumbar region with neurogenic claudication: Secondary | ICD-10-CM | POA: Diagnosis not present

## 2015-11-18 DIAGNOSIS — M7541 Impingement syndrome of right shoulder: Secondary | ICD-10-CM | POA: Diagnosis not present

## 2015-11-18 DIAGNOSIS — M25511 Pain in right shoulder: Secondary | ICD-10-CM | POA: Diagnosis not present

## 2015-11-18 DIAGNOSIS — S46011A Strain of muscle(s) and tendon(s) of the rotator cuff of right shoulder, initial encounter: Secondary | ICD-10-CM | POA: Diagnosis not present

## 2015-11-27 DIAGNOSIS — M7541 Impingement syndrome of right shoulder: Secondary | ICD-10-CM | POA: Diagnosis not present

## 2015-12-03 DIAGNOSIS — M5440 Lumbago with sciatica, unspecified side: Secondary | ICD-10-CM | POA: Diagnosis not present

## 2015-12-03 DIAGNOSIS — M545 Low back pain: Secondary | ICD-10-CM | POA: Diagnosis not present

## 2015-12-04 DIAGNOSIS — M7542 Impingement syndrome of left shoulder: Secondary | ICD-10-CM | POA: Diagnosis not present

## 2015-12-04 DIAGNOSIS — M7541 Impingement syndrome of right shoulder: Secondary | ICD-10-CM | POA: Diagnosis not present

## 2015-12-04 DIAGNOSIS — S4351XA Sprain of right acromioclavicular joint, initial encounter: Secondary | ICD-10-CM | POA: Diagnosis not present

## 2015-12-31 ENCOUNTER — Other Ambulatory Visit: Payer: Self-pay | Admitting: Nurse Practitioner

## 2015-12-31 DIAGNOSIS — K219 Gastro-esophageal reflux disease without esophagitis: Secondary | ICD-10-CM

## 2016-01-05 ENCOUNTER — Other Ambulatory Visit: Payer: Self-pay | Admitting: *Deleted

## 2016-01-05 DIAGNOSIS — K219 Gastro-esophageal reflux disease without esophagitis: Secondary | ICD-10-CM

## 2016-01-05 MED ORDER — ESOMEPRAZOLE MAGNESIUM 40 MG PO CPDR: 40.0000 mg | DELAYED_RELEASE_CAPSULE | Freq: Every day | ORAL | 3 refills | Status: DC

## 2016-01-07 ENCOUNTER — Telehealth: Payer: Self-pay | Admitting: *Deleted

## 2016-01-07 MED ORDER — PANTOPRAZOLE SODIUM 40 MG PO TBEC: 40.0000 mg | DELAYED_RELEASE_TABLET | Freq: Every day | ORAL | 3 refills | Status: DC

## 2016-01-07 NOTE — Telephone Encounter (Signed)
[facility] must try and fail Pantoprazole 40mg  daily x 3 mths before they will cover Nexium  Please review and advise

## 2016-01-07 NOTE — Telephone Encounter (Signed)
Insurance denied nexium and he needs it changed to something else. Please advise

## 2016-01-25 DIAGNOSIS — M7542 Impingement syndrome of left shoulder: Secondary | ICD-10-CM | POA: Diagnosis not present

## 2016-01-25 DIAGNOSIS — M7541 Impingement syndrome of right shoulder: Secondary | ICD-10-CM | POA: Diagnosis not present

## 2016-02-15 ENCOUNTER — Encounter: Payer: BLUE CROSS/BLUE SHIELD | Admitting: Nurse Practitioner

## 2016-02-17 ENCOUNTER — Encounter: Payer: Self-pay | Admitting: Nurse Practitioner

## 2016-02-17 ENCOUNTER — Ambulatory Visit (INDEPENDENT_AMBULATORY_CARE_PROVIDER_SITE_OTHER): Payer: BLUE CROSS/BLUE SHIELD | Admitting: Nurse Practitioner

## 2016-02-17 VITALS — BP 128/86 | HR 85 | Temp 97.4°F | Ht 73.0 in | Wt 252.0 lb

## 2016-02-17 DIAGNOSIS — M549 Dorsalgia, unspecified: Secondary | ICD-10-CM

## 2016-02-17 DIAGNOSIS — J3089 Other allergic rhinitis: Secondary | ICD-10-CM

## 2016-02-17 DIAGNOSIS — Z125 Encounter for screening for malignant neoplasm of prostate: Secondary | ICD-10-CM

## 2016-02-17 DIAGNOSIS — Z0000 Encounter for general adult medical examination without abnormal findings: Secondary | ICD-10-CM | POA: Diagnosis not present

## 2016-02-17 DIAGNOSIS — E785 Hyperlipidemia, unspecified: Secondary | ICD-10-CM

## 2016-02-17 DIAGNOSIS — Z6833 Body mass index (BMI) 33.0-33.9, adult: Secondary | ICD-10-CM

## 2016-02-17 DIAGNOSIS — G8929 Other chronic pain: Secondary | ICD-10-CM

## 2016-02-17 DIAGNOSIS — K219 Gastro-esophageal reflux disease without esophagitis: Secondary | ICD-10-CM

## 2016-02-17 DIAGNOSIS — F17200 Nicotine dependence, unspecified, uncomplicated: Secondary | ICD-10-CM

## 2016-02-17 MED ORDER — PANTOPRAZOLE SODIUM 40 MG PO TBEC: 40.0000 mg | DELAYED_RELEASE_TABLET | Freq: Every day | ORAL | 3 refills | Status: DC

## 2016-02-17 MED ORDER — ATORVASTATIN CALCIUM 40 MG PO TABS: 40.0000 mg | ORAL_TABLET | Freq: Every day | ORAL | 5 refills | Status: DC

## 2016-02-17 NOTE — Patient Instructions (Signed)
What You Need to Know About Smokeless Tobacco Use Tobacco use is one of the leading causes of cancer and other chronic health problems. Smokeless tobacco is tobacco that is put directly into the mouth instead of being smoked. It may also be called chewing tobacco or snuff. Smokeless tobacco is made from the leaves of tobacco plants and it comes in several forms:  Loose, dry leaves, plugs, or twists.  Moist pouches.  Dissolving lozenges or strips. Chewing, sucking, or holding the tobacco in your mouth causes your mouth to make more saliva. The saliva mixes with the tobacco to make "tobacco juice" that is swallowed or spit out. How can smokeless tobacco affect me? Using smokeless tobacco:  Increases your risk of developing cancer. Smokeless tobacco contains at least 28 different types of cancer-causing chemicals (carcinogens).  Increases your chances of developing other long-term health problems, including high blood pressure, heart disease, stroke, and dental problems.  Can make you become addicted. Nicotine is one of the chemicals in tobacco. When you chew tobacco, you absorb nicotine from the tobacco juice. This can make you feel more alert than usual.  Can cause problems with pregnancy. Pregnant women who use smokeless tobacco are more likely to miscarry or deliver a baby too early (premature delivery).  Can affect the appearance and health of your mouth. Using smokeless tobacco may cause bad breath, yellow-brown teeth, mouth sores, cracking and bleeding lips, gum recession, and lesions on the soft tissues of your mouth (leukoplakia). What are the benefits of not using smokeless tobacco? The benefits of not using smokeless tobacco include:  A healthy mind because:  You avoid addiction.  A healthy body because:  You avoid dental problems.  You promote healthy pregnancy.  You avoid long-term health problems.  A healthy wallet because:  You avoid costs of buying tobacco.  You  avoid health care costs in the future.  A healthy family because:  You avoid accidental poisoning of children in your household. What can happen if I continue to use smokeless tobacco? If you continue to use smokeless tobacco, you will increase your risk for developing certain cancers. These include:  Tongue.  Lips, mouth, and gums.  Throat (esophagus) and voice box (larynx).  Stomach.  Pancreas.  Bladder.  Colon. Long-term use of smokeless tobacco can also lead to:  High blood pressure, heart disease, and stroke.  Gum disease, gum recession, and bone loss around the teeth.  Tooth decay. How do I quit using smokeless tobacco? Quitting the use of smokeless tobacco can be hard, but it can be done. Follow these steps:  Pick a date to quit. Set a date within the next two weeks. This gives you time to prepare.  Write down the reasons why you are quitting. Keep this list in places where you will see it often, such as on your bathroom mirror or in your car or wallet.  Identify the people, places, things, and activities that make you want to use tobacco (triggers) and avoid them.  Get rid of any tobacco you have and remove any tobacco smells. To do this:  Throw away all containers of tobacco at home, at work, and in your car.  Throw away any other items that you use regularly when you chew tobacco.  Clean your car and make sure to remove all tobacco-related items.  Clean your home, including curtains and carpets.  Tell your family, friends, and coworkers that you are quitting. This can make quitting easier.  Ask your health   care provider for help quitting smokeless tobacco. This may involve treatment. Find out what treatment options are covered by your health insurance.  Keep track of how many days have passed since you quit. Remembering how long and hard you have worked to quit can help you avoid using tobacco again. Where can I get support? Ask your health care provider  if there is a local support group for quitting smokeless tobacco. Where can I get more information? You can learn more about the risks of using smokeless tobacco and the benefits of quitting from these sources:  National Cancer Institute: www.cancer.gov  American Cancer Society: www.cancer.org When should I seek medical care? Seek medical care if you have:  White or other discolored patches in your mouth.  Difficulty swallowing.  A change in your voice.  Unexplained weight loss.  Stomach pain, nausea, or vomiting. Summary  Smokeless tobacco contains at least 28 different chemicals that are known to cause cancer (carcinogen).  Nicotine is an addictive chemical in smokeless tobacco.  When you quit using smokeless tobacco, you lower your risk of developing cancer. This information is not intended to replace advice given to you by your health care provider. Make sure you discuss any questions you have with your health care provider. Document Released: 06/07/2010 Document Revised: 08/29/2015 Document Reviewed: 08/15/2014 Elsevier Interactive Patient Education  2017 Elsevier Inc.  

## 2016-02-17 NOTE — Progress Notes (Signed)
Subjective:    Patient ID: [name] [name], male    DOB: 06/19/1965, 51 y.o.   MRN: [id number]  Patient here today for CPEand follow up of chronic medical problems. No complaints today  Outpatient Encounter Prescriptions as of 02/17/2016  Medication Sig  . atorvastatin (LIPITOR) 40 MG tablet Take 1 tablet (40 mg total) by mouth daily.  . budesonide-formoterol (SYMBICORT) 160-4.5 MCG/ACT inhaler Inhale 2 puffs into the lungs as needed.  . fluticasone (FLONASE) 50 MCG/ACT nasal spray Place 2 sprays into both nostrils daily.  Marland Kitchen HYDROcodone-acetaminophen (NORCO) 10-325 MG per tablet   . loratadine (CLARITIN) 10 MG tablet Take 1 tablet (10 mg total) by mouth daily.  . naproxen (NAPROSYN) 500 MG tablet TAKE ONE TABLET BY MOUTH TWICE DAILY WITH MEALS  . pantoprazole (PROTONIX) 40 MG tablet Take 1 tablet (40 mg total) by mouth daily.   Facility-Administered Encounter Medications as of 02/17/2016  Medication  . betamethasone acetate-betamethasone sodium phosphate (CELESTONE) injection 6 mg     Hyperlipidemia  This is a chronic problem. The current episode started more than 1 year ago. The problem is controlled. Recent lipid tests were reviewed and are normal. Exacerbating diseases include obesity. Pertinent negatives include no myalgias or shortness of breath. Current antihyperlipidemic treatment includes statins. The current treatment provides moderate improvement of lipids. Compliance problems include adherence to diet and adherence to exercise.  Risk factors for coronary artery disease include dyslipidemia, family history, hypertension, obesity and male sex.  COPD Uses Symbicort PRN. Smoker. Allergic rhinitis Hasn't used flonase in awhile but uses [facility] brand antihistamine daily. Chronic Low back pain Sees Dr.Harkins at pain management for pain pills. GERD Patient is on nexium and works well for symptoms   Review of Systems  Constitutional: Negative.   HENT: Negative.   Respiratory:  Negative.  Negative for shortness of breath.   Cardiovascular: Negative.   Gastrointestinal: Negative.   Genitourinary: Negative.   Musculoskeletal: Negative for myalgias.       Right shoulder pain.  Neurological: Negative.   Psychiatric/Behavioral: Negative.   All other systems reviewed and are negative.      Objective:   Physical Exam  Constitutional: He is oriented to person, place, and time. He appears well-developed and well-nourished.  HENT:  Head: Normocephalic.  Right Ear: External ear normal.  Left Ear: External ear normal.  Nose: Nose normal.  Mouth/Throat: Oropharynx is clear and moist.  Eyes: EOM are normal. Pupils are equal, round, and reactive to light.  Neck: Normal range of motion. Neck supple. No JVD present. No thyromegaly present.  Cardiovascular: Normal rate, regular rhythm, normal heart sounds and intact distal pulses.  Exam reveals no gallop and no friction rub.   No murmur heard. Pulmonary/Chest: Effort normal and breath sounds normal. No respiratory distress. He has no wheezes. He has no rales. He exhibits no tenderness.  Abdominal: Soft. Bowel sounds are normal. He exhibits no mass. There is no tenderness.  Musculoskeletal: Normal range of motion. He exhibits no edema.  Lymphadenopathy:    He has no cervical adenopathy.  Neurological: He is alert and oriented to person, place, and time. He has normal reflexes. No cranial nerve deficit.  Skin: Skin is warm and dry.  Psychiatric: He has a normal mood and affect. His behavior is normal. Judgment and thought content normal.    BP 128/86   Pulse 85   Temp 97.4 F (36.3 C) (Oral)   Ht '6\' 1"'  (1.854 m)   Wt 252 lb (114.3 kg)  BMI 33.25 kg/m      Assessment & Plan:  1. Annual physical exam - CBC with Differential/Platelet  2. Chronic nonseasonal allergic rhinitis due to pollen  3. Gastroesophageal reflux disease without esophagitis Avoid spicy foods Do not eat 2 hours prior to bedtime -  pantoprazole (PROTONIX) 40 MG tablet; Take 1 tablet (40 mg total) by mouth daily.  Dispense: 30 tablet; Refill: 3  4. BMI 33.0-33.9,adult Discussed diet and exercise for person with BMI >25 Will recheck weight in 3-6 months  5. Hyperlipidemia with target LDL less than 100 Low fat diet - atorvastatin (LIPITOR) 40 MG tablet; Take 1 tablet (40 mg total) by mouth daily.  Dispense: 30 tablet; Refill: 5 - CMP14+EGFR - Lipid panel  6. Chronic back pain, unspecified back location, unspecified back pain laterality Continue to see pain cinic  7. Prostate cancer screening - PSA, total and free  8. snuff Encouraged to stop using    Labs pending Health maintenance reviewed Diet and exercise encouraged Continue all meds Follow up  In 6 months   [place], FNP

## 2016-02-18 LAB — CMP14+EGFR
ALT: 18 IU/L (ref 0–44)
AST: 14 IU/L (ref 0–40)
Albumin/Globulin Ratio: 1.3 (ref 1.2–2.2)
Albumin: 3.9 g/dL (ref 3.5–5.5)
Alkaline Phosphatase: 128 IU/L — ABNORMAL HIGH (ref 39–117)
BUN/Creatinine Ratio: 13 (ref 9–20)
BUN: 11 mg/dL (ref 6–24)
Bilirubin Total: 0.5 mg/dL (ref 0.0–1.2)
CO2: 25 mmol/L (ref 18–29)
Calcium: 9 mg/dL (ref 8.7–10.2)
Chloride: 100 mmol/L (ref 96–106)
Creatinine, Ser: 0.84 mg/dL (ref 0.76–1.27)
GFR calc Af Amer: 120 mL/min/{1.73_m2} (ref >59)
GFR calc non Af Amer: 104 mL/min/{1.73_m2} (ref >59)
Globulin, Total: 3 g/dL (ref 1.5–4.5)
Glucose: 88 mg/dL (ref 65–99)
Potassium: 4 mmol/L (ref 3.5–5.2)
Sodium: 140 mmol/L (ref 134–144)
Total Protein: 6.9 g/dL (ref 6.0–8.5)

## 2016-02-18 LAB — CBC WITH DIFFERENTIAL/PLATELET
Basophils Absolute: 0.1 10*3/uL (ref 0.0–0.2)
Basos: 1 %
EOS (ABSOLUTE): 0.1 10*3/uL (ref 0.0–0.4)
Eos: 1 %
Hematocrit: 44.1 % (ref 37.5–51.0)
Hemoglobin: 15.7 g/dL (ref 13.0–17.7)
Immature Grans (Abs): 0 10*3/uL (ref 0.0–0.1)
Immature Granulocytes: 0 %
Lymphocytes Absolute: 2.6 10*3/uL (ref 0.7–3.1)
Lymphs: 29 %
MCH: 33.9 pg — ABNORMAL HIGH (ref 26.6–33.0)
MCHC: 35.6 g/dL (ref 31.5–35.7)
MCV: 95 fL (ref 79–97)
Monocytes Absolute: 0.6 10*3/uL (ref 0.1–0.9)
Monocytes: 7 %
Neutrophils Absolute: 5.6 10*3/uL (ref 1.4–7.0)
Neutrophils: 62 %
Platelets: 233 10*3/uL (ref 150–379)
RBC: 4.63 x10E6/uL (ref 4.14–5.80)
RDW: 13.8 % (ref 12.3–15.4)
WBC: 9 10*3/uL (ref 3.4–10.8)

## 2016-02-18 LAB — PSA, TOTAL AND FREE
PSA, Free Pct: 16.4 %
PSA, Free: 0.18 ng/mL
Prostate Specific Ag, Serum: 1.1 ng/mL (ref 0.0–4.0)

## 2016-02-18 LAB — LIPID PANEL
Chol/HDL Ratio: 4.2 ratio units (ref 0.0–5.0)
Cholesterol, Total: 143 mg/dL (ref 100–199)
HDL: 34 mg/dL — ABNORMAL LOW (ref >39)
LDL Calculated: 96 mg/dL (ref 0–99)
Triglycerides: 67 mg/dL (ref 0–149)
VLDL Cholesterol Cal: 13 mg/dL (ref 5–40)

## 2016-03-17 DIAGNOSIS — M4727 Other spondylosis with radiculopathy, lumbosacral region: Secondary | ICD-10-CM | POA: Diagnosis not present

## 2016-03-17 DIAGNOSIS — M5417 Radiculopathy, lumbosacral region: Secondary | ICD-10-CM | POA: Diagnosis not present

## 2016-07-28 DIAGNOSIS — M4727 Other spondylosis with radiculopathy, lumbosacral region: Secondary | ICD-10-CM | POA: Diagnosis not present

## 2016-07-28 DIAGNOSIS — M5417 Radiculopathy, lumbosacral region: Secondary | ICD-10-CM | POA: Diagnosis not present

## 2016-08-19 ENCOUNTER — Ambulatory Visit (INDEPENDENT_AMBULATORY_CARE_PROVIDER_SITE_OTHER): Payer: BLUE CROSS/BLUE SHIELD | Admitting: Family Medicine

## 2016-08-19 ENCOUNTER — Encounter: Payer: Self-pay | Admitting: Family Medicine

## 2016-08-19 VITALS — BP 121/81 | HR 90 | Temp 98.2°F | Ht 73.0 in | Wt 240.0 lb

## 2016-08-19 DIAGNOSIS — B3742 Candidal balanitis: Secondary | ICD-10-CM | POA: Diagnosis not present

## 2016-08-19 MED ORDER — NYSTATIN 100000 UNIT/GM EX CREA: 1.0000 "application " | TOPICAL_CREAM | Freq: Three times a day (TID) | CUTANEOUS | 1 refills | Status: DC

## 2016-08-19 NOTE — Progress Notes (Signed)
Patient ID: [name] RollsJeremy T [name], male   DOB: 12/04/1965, 51 y.o.   MRN: [id number] Patient here today for private area redness and swelling that was first noticed 2 days ago. Area in question is just proximal to the glans. He does work around [employer] but typically wears gloves. He is concerned that he may have gotten some chemical on the penile shaft. There was some initial itching as well as swelling and inflammation.  O: Exam confined to the penis Normal circumcised male. There is swelling of the tissues proximal to the glans as well as some whitish area suggestive of possible yeast infection. I cannot rule out other problems such as lichen planus. Plan to treat this with clotrimazole and triamcinolone. Patient instructed to keep area as dry as possible. If symptoms do not resolve would recommend dermatology consult

## 2016-11-24 ENCOUNTER — Encounter: Payer: Self-pay | Admitting: Nurse Practitioner

## 2016-11-24 ENCOUNTER — Ambulatory Visit: Payer: BLUE CROSS/BLUE SHIELD | Admitting: Nurse Practitioner

## 2016-11-24 VITALS — BP 122/82 | HR 71 | Temp 97.9°F | Ht 73.0 in | Wt 243.0 lb

## 2016-11-24 DIAGNOSIS — H65491 Other chronic nonsuppurative otitis media, right ear: Secondary | ICD-10-CM | POA: Diagnosis not present

## 2016-11-24 NOTE — Progress Notes (Signed)
   Subjective:    Patient ID: [name] RollsJeremy T [name], male    DOB: 02/09/1965, 51 y.o.   MRN: [id number]  HPI Patient comes in today c/o decrease hearing in right ear. Unable  To clean it out with qtip. Occasionally gets blood on q tip. 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   HENT: Positive for ear pain and hearing loss (decreased in right ear).   Respiratory: Negative.   Cardiovascular: Negative.   Neurological: Negative.   Psychiatric/Behavioral: Negative.   All other systems reviewed and are negative.      Objective:   Physical Exam  Constitutional: He appears well-developed and well-nourished. No distress.  HENT:  Right Ear: A middle ear effusion is present. Decreased hearing is noted.  Left Ear: Tympanic membrane is scarred.  Nose: Nose normal. Right sinus exhibits no maxillary sinus tenderness and no frontal sinus tenderness. Left sinus exhibits no maxillary sinus tenderness and no frontal sinus tenderness.  Mouth/Throat: Uvula is midline, oropharynx is clear and moist and mucous membranes are normal.  Right tm is yellow   Cardiovascular: Normal rate.  Pulmonary/Chest: Effort normal.  Neurological: He is alert.  Skin: Skin is warm.  Psychiatric: He has a normal mood and affect. His behavior is normal. Judgment and thought content normal.    BP 122/82   Pulse 71   Temp 97.9 F (36.6 C) (Oral)   Ht 6\' 1"  (1.854 m)   Wt 243 lb (110.2 kg)   BMI 32.06 kg/m        Assessment & Plan:  1. Chronic otitis media of right ear with effusion Do not stick q tips in ear Try flonase nasal spray OTC to se if will help open up ear - Ambulatory referral to ENT  Mary-Margaret Daphine DeutscherMartin, FNP

## 2016-11-24 NOTE — Patient Instructions (Signed)
Otitis Media With Effusion, Pediatric Otitis media with effusion (OME) occurs when there is inflammation of the middle ear and fluid in the middle ear space. There are no signs and symptoms of infection. The middle ear space contains air and the bones for hearing. Air in the middle ear space helps to transmit sound to the brain. OME is a common condition in children, and it often occurs after an ear infection. This condition may be present for several weeks or longer after an ear infection. Most cases of this condition get better on their own. What are the causes? OME is caused by a blockage of the eustachian tube in one or both ears. These tubes drain fluid in the ears to the back of the nose (nasopharynx). If the tissue in the tube swells up (edema), the tube closes. This prevents fluid from draining. Blockage can be caused by:  Ear infections.  Colds and other upper respiratory infections.  Allergies.  Irritants, such as tobacco smoke.  Enlarged adenoids. The adenoids are areas of soft tissue located high in the back of the throat, behind the nose and the roof of the mouth. They are part of the body's natural defense (immune) system.  A mass in the nasopharynx.  Damage to the ear caused by pressure changes (barotrauma).  What increases the risk? Your child is more likely to develop this condition if:  He or she has repeated ear and sinus infections.  He or she has allergies.  He or she is exposed to tobacco smoke.  He or she attends daycare.  He or she is not breastfed.  What are the signs or symptoms? Symptoms of this condition may not be obvious. Sometimes this condition does not have any symptoms, or symptoms may overlap with those of a cold or upper respiratory tract illness. Symptoms of this condition include:  Temporary hearing loss.  A feeling of fullness in the ear without pain.  Irritability or agitation.  Balance (vestibular) problems.  As a result of hearing  loss, your child may:  Listen to the TV at a loud volume.  Not respond to questions.  Ask "What?" often when spoken to.  Mistake or confuse one sound or word for another.  Perform poorly at school.  Have a poor attention span.  Become agitated or irritated easily.  How is this diagnosed? This condition is diagnosed with an ear exam. Your child's health care provider will look inside your child's ear with an instrument (otoscope) to check for redness, swelling, and fluid. Other tests may be done, including:  A test to check the movement of the eardrum (pneumatic otoscopy). This is done by squeezing a small amount of air into the ear.  A test that changes air pressure in the middle ear to check how well the eardrum moves and to see if the eustachian tube is working (tympanogram).  Hearing test (audiogram). This test involves playing tones at different pitches to see if your child can hear each tone.  How is this treated? Treatment for this condition depends on the cause. In many cases, the fluid goes away on its own. In some cases, your child may need a procedure to create a hole in the eardrum to allow fluid to drain (myringotomy) and to insert small drainage tubes (tympanostomy tubes) into the eardrums. These tubes help to drain fluid and prevent infection. This procedure may be recommended if:  OME does not get better over several months.  Your child has many ear   infections within several months.  Your child has noticeable hearing loss.  Your child has problems with speech and language development.  Surgery may also be done to remove the adenoids (adenoidectomy). Follow these instructions at home:  Give over-the-counter and prescription medicines only as told by your child's health care provider.  Keep children away from any tobacco smoke.  Keep all follow-up visits as told by your child's health care provider. This is important. How is this prevented?  Keep your  child's vaccinations up to date. Make sure your child gets all recommended vaccinations, including a pneumonia and flu vaccine.  Encourage hand washing. Your child should wash his or her hands often with soap and water. If there is no soap and water, he or she should use hand sanitizer.  Avoid exposing your child to tobacco smoke.  Breastfeed your baby, if possible. Babies who are breastfed as long as possible are less likely to develop this condition. Contact a health care provider if:  Your child's hearing does not get better after 3 months.  Your child's hearing is worse.  Your child has ear pain.  Your child has a fever.  Your child has drainage from the ear.  Your child is dizzy.  Your child has a lump on his or her neck. Get help right away if:  Your child has bleeding from the nose.  Your child cannot move part of her or his face.  Your child has trouble breathing.  Your child cannot smell.  Your child develops severe congestion.  Your child develops weakness.  Your child who is younger than 3 months has a temperature of 100F (38C) or higher. Summary  Otitis media with effusion (OME) occurs when there is inflammation of the middle ear and fluid in the middle ear space.  This condition is caused by blockage of one or both eustachian tubes, which drain fluid in the ears to the back of the nose.  Symptoms of this condition can include temporary hearing loss, a feeling of fullness in the ear, irritability or agitation, and balance (vestibular) problems. Sometimes, there are no symptoms.  This condition is diagnosed with an ear exam and tests, such as pneumatic otoscopy, tympanogram, and audiogram.  Treatment for this condition depends on the cause. In many cases, the fluid goes away on its own. This information is not intended to replace advice given to you by your health care provider. Make sure you discuss any questions you have with your health care  provider. Document Released: 03/26/2003 Document Revised: 11/26/2015 Document Reviewed: 11/26/2015 Elsevier Interactive Patient Education  2017 Elsevier Inc.  

## 2016-12-01 DIAGNOSIS — M5417 Radiculopathy, lumbosacral region: Secondary | ICD-10-CM | POA: Diagnosis not present

## 2016-12-29 DIAGNOSIS — H7311 Chronic myringitis, right ear: Secondary | ICD-10-CM | POA: Diagnosis not present

## 2016-12-29 DIAGNOSIS — H9071 Mixed conductive and sensorineural hearing loss, unilateral, right ear, with unrestricted hearing on the contralateral side: Secondary | ICD-10-CM | POA: Diagnosis not present

## 2016-12-29 DIAGNOSIS — H7101 Cholesteatoma of attic, right ear: Secondary | ICD-10-CM | POA: Diagnosis not present

## 2016-12-29 DIAGNOSIS — H9311 Tinnitus, right ear: Secondary | ICD-10-CM | POA: Diagnosis not present

## 2017-01-02 ENCOUNTER — Other Ambulatory Visit: Payer: Self-pay | Admitting: Otolaryngology

## 2017-01-02 DIAGNOSIS — H7101 Cholesteatoma of attic, right ear: Secondary | ICD-10-CM

## 2017-01-02 DIAGNOSIS — H7311 Chronic myringitis, right ear: Secondary | ICD-10-CM

## 2017-01-11 ENCOUNTER — Ambulatory Visit
Admission: RE | Admit: 2017-01-11 | Discharge: 2017-01-11 | Disposition: A | Discharge status: Home / Self Care | Payer: BLUE CROSS/BLUE SHIELD | Source: Ambulatory Visit | Attending: Otolaryngology | Admitting: Otolaryngology

## 2017-01-11 DIAGNOSIS — H9191 Unspecified hearing loss, right ear: Secondary | ICD-10-CM | POA: Diagnosis not present

## 2017-01-11 DIAGNOSIS — H7311 Chronic myringitis, right ear: Secondary | ICD-10-CM

## 2017-01-11 DIAGNOSIS — H7101 Cholesteatoma of attic, right ear: Secondary | ICD-10-CM

## 2017-01-11 MED ORDER — IOPAMIDOL (ISOVUE-300) INJECTION 61%
75.0000 mL | Freq: Once | INTRAVENOUS | Status: AC | PRN
Start: 2017-01-11 — End: 2017-01-11
  Administered 2017-01-11: 75 mL via INTRAVENOUS

## 2017-01-18 ENCOUNTER — Encounter: Payer: Self-pay | Admitting: Nurse Practitioner

## 2017-01-18 ENCOUNTER — Ambulatory Visit (INDEPENDENT_AMBULATORY_CARE_PROVIDER_SITE_OTHER): Payer: BLUE CROSS/BLUE SHIELD | Admitting: Nurse Practitioner

## 2017-01-18 VITALS — BP 115/81 | HR 82 | Temp 97.7°F | Ht 73.0 in | Wt 242.0 lb

## 2017-01-18 DIAGNOSIS — Z0000 Encounter for general adult medical examination without abnormal findings: Secondary | ICD-10-CM

## 2017-01-18 DIAGNOSIS — Z6833 Body mass index (BMI) 33.0-33.9, adult: Secondary | ICD-10-CM

## 2017-01-18 DIAGNOSIS — F17200 Nicotine dependence, unspecified, uncomplicated: Secondary | ICD-10-CM

## 2017-01-18 DIAGNOSIS — E785 Hyperlipidemia, unspecified: Secondary | ICD-10-CM | POA: Diagnosis not present

## 2017-01-18 DIAGNOSIS — K219 Gastro-esophageal reflux disease without esophagitis: Secondary | ICD-10-CM

## 2017-01-18 NOTE — Progress Notes (Signed)
Subjective:    Patient ID: [name] [name], male    DOB: May 26, 1965, 52 y.o.   MRN: [id number]  HPI  ABISAI DEER is here today for follow up of chronic medical problem.  Outpatient Encounter Medications as of 01/18/2017  Medication Sig  . atorvastatin (LIPITOR) 40 MG tablet Take 1 tablet (40 mg total) by mouth daily.  . budesonide-formoterol (SYMBICORT) 160-4.5 MCG/ACT inhaler Inhale 2 puffs into the lungs as needed.  . fluticasone (FLONASE) 50 MCG/ACT nasal spray Place 2 sprays into both nostrils daily.  Marland Kitchen HYDROcodone-acetaminophen (NORCO) 10-325 MG tablet   . loratadine (CLARITIN) 10 MG tablet Take 1 tablet (10 mg total) by mouth daily.  . naproxen (NAPROSYN) 500 MG tablet TAKE ONE TABLET BY MOUTH TWICE DAILY WITH MEALS  . nystatin cream (MYCOSTATIN) Apply 1 application topically 3 (three) times daily.  . pantoprazole (PROTONIX) 40 MG tablet Take 1 tablet (40 mg total) by mouth daily.     1. Annual physical exam   2. Gastroesophageal reflux disease without esophagitis  Takes protonix daly- has bad reflux if does not take meds  3. Hyperlipidemia with target LDL less than 100  Does not watch diet at all. Takes lipitor daily without side effects  4. snuff  Has no desire to stop- uses about 3x a day  5. 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     Review of Systems  Constitutional: Negative for activity change and appetite change.  HENT: Negative.   Eyes: Negative for pain.  Respiratory: Negative for shortness of breath.   Cardiovascular: Negative for chest pain, palpitations and leg swelling.  Gastrointestinal: Negative for abdominal pain.  Endocrine: Negative for polydipsia.  Genitourinary: Negative.   Skin: Negative for rash.  Neurological: Negative for dizziness, weakness and headaches.  Hematological: Does not bruise/bleed easily.    Psychiatric/Behavioral: Negative.   All other systems reviewed and are negative.      Objective:   Physical Exam  Constitutional: He is oriented to person, place, and time. He appears well-developed and well-nourished.  HENT:  Head: Normocephalic.  Right Ear: External ear normal.  Left Ear: External ear normal.  Nose: Nose normal.  Mouth/Throat: Oropharynx is clear and moist.  Eyes: EOM are normal. Pupils are equal, round, and reactive to light.  Neck: Normal range of motion. Neck supple. No JVD present. No thyromegaly present.  Cardiovascular: Normal rate, regular rhythm, normal heart sounds and intact distal pulses. Exam reveals no gallop and no friction rub.  No murmur heard. Pulmonary/Chest: Effort normal and breath sounds normal. No respiratory distress. He has no wheezes. He has no rales. He exhibits no tenderness.  Abdominal: Soft. Bowel sounds are normal. He exhibits no mass. There is no tenderness.  Genitourinary: Prostate normal and penis normal.  Musculoskeletal: Normal range of motion. He exhibits no edema.  Lymphadenopathy:    He has no cervical adenopathy.  Neurological: He is alert and oriented to person, place, and time. No cranial nerve deficit.  Skin: Skin is warm and dry.  Psychiatric: He has a normal mood and affect. His behavior is normal. Judgment and thought content normal.   BP 115/81   Pulse 82   Temp 97.7 F (36.5 C) (Oral)   Ht _0  (1.854 m)   Wt 242 lb (109.8 kg)   BMI 31.93 kg/m         Assessment &  Plan:  1. Annual physical exam - CBC with Differential/Platelet - PSA, total and free  2. Gastroesophageal reflux disease without esophagitis Avoid spicy foods Do not eat 2 hours prior to bedtime  3. Hyperlipidemia with target LDL less than 100 Low fat diet - CMP14+EGFR - Lipid panel  4. snuff Encouraged to quit  5. BMI 33.0-33.9,adult Discussed diet and exercise for person with BMI >25 Will recheck weight in 3-6  months     Labs pending Health maintenance reviewed Diet and exercise encouraged Continue all meds Follow up  In 6 months   [place], FNP

## 2017-01-18 NOTE — Patient Instructions (Signed)
What You Need to Know About Smokeless Tobacco Use Tobacco use is one of the leading causes of cancer and other chronic health problems. Smokeless tobacco is tobacco that is put directly into the mouth instead of being smoked. It may also be called chewing tobacco or snuff. Smokeless tobacco is made from the leaves of tobacco plants and it comes in several forms:  Loose, dry leaves, plugs, or twists.  Moist pouches.  Dissolving lozenges or strips.  Chewing, sucking, or holding the tobacco in your mouth causes your mouth to make more saliva. The saliva mixes with the tobacco to make "tobacco juice" that is swallowed or spit out. How can smokeless tobacco affect me? Using smokeless tobacco:  Increases your risk of developing cancer. Smokeless tobacco contains at least 28 different types of cancer-causing chemicals (carcinogens).  Increases your chances of developing other long-term health problems, including high blood pressure, heart disease, stroke, and dental problems.  Can make you become addicted. Nicotine is one of the chemicals in tobacco. When you chew tobacco, you absorb nicotine from the tobacco juice. This can make you feel more alert than usual.  Can cause problems with pregnancy. Pregnant women who use smokeless tobacco are more likely to miscarry or deliver a baby too early (premature delivery).  Can affect the appearance and health of your mouth. Using smokeless tobacco may cause bad breath, yellow-brown teeth, mouth sores, cracking and bleeding lips, gum recession, and lesions on the soft tissues of your mouth (leukoplakia).  What are the benefits of not using smokeless tobacco? The benefits of not using smokeless tobacco include:  A healthy mind because: ? You avoid addiction.  A healthy body because: ? You avoid dental problems. ? You promote healthy pregnancy. ? You avoid long-term health problems.  A healthy wallet because: ? You avoid costs of buying  tobacco. ? You avoid health care costs in the future.  A healthy family because: ? You avoid accidental poisoning of children in your household.  What can happen if I continue to use smokeless tobacco? If you continue to use smokeless tobacco, you will increase your risk for developing certain cancers. These include:  Tongue.  Lips, mouth, and gums.  Throat (esophagus) and voice box (larynx).  Stomach.  Pancreas.  Bladder.  Colon.  Long-term use of smokeless tobacco can also lead to:  High blood pressure, heart disease, and stroke.  Gum disease, gum recession, and bone loss around the teeth.  Tooth decay.  How do I quit using smokeless tobacco? Quitting the use of smokeless tobacco can be hard, but it can be done. Follow these steps:  Pick a date to quit. Set a date within the next two weeks. This gives you time to prepare.  Write down the reasons why you are quitting. Keep this list in places where you will see it often, such as on your bathroom mirror or in your car or wallet.  Identify the people, places, things, and activities that make you want to use tobacco (triggers) and avoid them.  Get rid of any tobacco you have and remove any tobacco smells. To do this: ? Throw away all containers of tobacco at home, at work, and in your car. ? Throw away any other items that you use regularly when you chew tobacco. ? Clean your car and make sure to remove all tobacco-related items. ? Clean your home, including curtains and carpets.  Tell your family, friends, and coworkers that you are quitting. This can make quitting   easier.  Ask your health care provider for help quitting smokeless tobacco. This may involve treatment. Find out what treatment options are covered by your health insurance.  Keep track of how many days have passed since you quit. Remembering how long and hard you have worked to quit can help you avoid using tobacco again.  Where can I get support? Ask  your health care provider if there is a local support group for quitting smokeless tobacco. Where can I get more information? You can learn more about the risks of using smokeless tobacco and the benefits of quitting from these sources:  National Cancer Institute: www.cancer.gov  American Cancer Society: www.cancer.org  When should I seek medical care? Seek medical care if you have:  White or other discolored patches in your mouth.  Difficulty swallowing.  A change in your voice.  Unexplained weight loss.  Stomach pain, nausea, or vomiting.  Summary  Smokeless tobacco contains at least 28 different chemicals that are known to cause cancer (carcinogen).  Nicotine is an addictive chemical in smokeless tobacco.  When you quit using smokeless tobacco, you lower your risk of developing cancer. This information is not intended to replace advice given to you by your health care provider. Make sure you discuss any questions you have with your health care provider. Document Released: 06/07/2010 Document Revised: 08/29/2015 Document Reviewed: 08/15/2014 Elsevier Interactive Patient Education  2018 Elsevier Inc.  

## 2017-01-19 DIAGNOSIS — H9071 Mixed conductive and sensorineural hearing loss, unilateral, right ear, with unrestricted hearing on the contralateral side: Secondary | ICD-10-CM | POA: Diagnosis not present

## 2017-01-19 DIAGNOSIS — H7101 Cholesteatoma of attic, right ear: Secondary | ICD-10-CM | POA: Diagnosis not present

## 2017-01-19 DIAGNOSIS — H7011 Chronic mastoiditis, right ear: Secondary | ICD-10-CM | POA: Diagnosis not present

## 2017-01-19 DIAGNOSIS — H9311 Tinnitus, right ear: Secondary | ICD-10-CM | POA: Diagnosis not present

## 2017-01-19 LAB — CBC WITH DIFFERENTIAL/PLATELET
Basophils Absolute: 0.1 10*3/uL (ref 0.0–0.2)
Basos: 1 %
EOS (ABSOLUTE): 0.2 10*3/uL (ref 0.0–0.4)
Eos: 2 %
Hematocrit: 49 % (ref 37.5–51.0)
Hemoglobin: 16.6 g/dL (ref 13.0–17.7)
Immature Grans (Abs): 0.1 10*3/uL (ref 0.0–0.1)
Immature Granulocytes: 1 %
Lymphocytes Absolute: 3.3 10*3/uL — ABNORMAL HIGH (ref 0.7–3.1)
Lymphs: 27 %
MCH: 33.3 pg — ABNORMAL HIGH (ref 26.6–33.0)
MCHC: 33.9 g/dL (ref 31.5–35.7)
MCV: 98 fL — ABNORMAL HIGH (ref 79–97)
Monocytes Absolute: 0.8 10*3/uL (ref 0.1–0.9)
Monocytes: 6 %
Neutrophils Absolute: 7.9 10*3/uL — ABNORMAL HIGH (ref 1.4–7.0)
Neutrophils: 63 %
Platelets: 229 10*3/uL (ref 150–379)
RBC: 4.99 x10E6/uL (ref 4.14–5.80)
RDW: 13.8 % (ref 12.3–15.4)
WBC: 12.3 10*3/uL — ABNORMAL HIGH (ref 3.4–10.8)

## 2017-01-19 LAB — CMP14+EGFR
ALT: 37 IU/L (ref 0–44)
AST: 23 IU/L (ref 0–40)
Albumin/Globulin Ratio: 1.7 (ref 1.2–2.2)
Albumin: 4.1 g/dL (ref 3.5–5.5)
Alkaline Phosphatase: 141 IU/L — ABNORMAL HIGH (ref 39–117)
BUN/Creatinine Ratio: 17 (ref 9–20)
BUN: 17 mg/dL (ref 6–24)
Bilirubin Total: 0.3 mg/dL (ref 0.0–1.2)
CO2: 20 mmol/L (ref 20–29)
Calcium: 9.1 mg/dL (ref 8.7–10.2)
Chloride: 100 mmol/L (ref 96–106)
Creatinine, Ser: 1.03 mg/dL (ref 0.76–1.27)
GFR calc Af Amer: 98 mL/min/{1.73_m2} (ref >59)
GFR calc non Af Amer: 85 mL/min/{1.73_m2} (ref >59)
Globulin, Total: 2.4 g/dL (ref 1.5–4.5)
Glucose: 84 mg/dL (ref 65–99)
Potassium: 4.3 mmol/L (ref 3.5–5.2)
Sodium: 140 mmol/L (ref 134–144)
Total Protein: 6.5 g/dL (ref 6.0–8.5)

## 2017-01-19 LAB — LIPID PANEL
Chol/HDL Ratio: 4.8 ratio (ref 0.0–5.0)
Cholesterol, Total: 178 mg/dL (ref 100–199)
HDL: 37 mg/dL — ABNORMAL LOW (ref >39)
LDL Calculated: 122 mg/dL — ABNORMAL HIGH (ref 0–99)
Triglycerides: 94 mg/dL (ref 0–149)
VLDL Cholesterol Cal: 19 mg/dL (ref 5–40)

## 2017-01-19 LAB — PSA, TOTAL AND FREE
PSA, Free Pct: 17.5 %
PSA, Free: 0.21 ng/mL
Prostate Specific Ag, Serum: 1.2 ng/mL (ref 0.0–4.0)

## 2017-02-02 ENCOUNTER — Other Ambulatory Visit: Payer: Self-pay | Admitting: Pediatrics

## 2017-02-06 ENCOUNTER — Other Ambulatory Visit: Payer: Self-pay | Admitting: Otolaryngology

## 2017-02-06 DIAGNOSIS — H7101 Cholesteatoma of attic, right ear: Secondary | ICD-10-CM | POA: Diagnosis not present

## 2017-02-06 DIAGNOSIS — H9071 Mixed conductive and sensorineural hearing loss, unilateral, right ear, with unrestricted hearing on the contralateral side: Secondary | ICD-10-CM | POA: Diagnosis not present

## 2017-02-06 DIAGNOSIS — H7191 Unspecified cholesteatoma, right ear: Secondary | ICD-10-CM | POA: Diagnosis not present

## 2017-02-06 DIAGNOSIS — H7111 Cholesteatoma of tympanum, right ear: Secondary | ICD-10-CM | POA: Diagnosis not present

## 2017-02-06 DIAGNOSIS — H7121 Cholesteatoma of mastoid, right ear: Secondary | ICD-10-CM | POA: Diagnosis not present

## 2017-02-06 DIAGNOSIS — H7011 Chronic mastoiditis, right ear: Secondary | ICD-10-CM | POA: Diagnosis not present

## 2017-03-22 ENCOUNTER — Other Ambulatory Visit: Payer: Self-pay | Admitting: Nurse Practitioner

## 2017-03-22 DIAGNOSIS — E785 Hyperlipidemia, unspecified: Secondary | ICD-10-CM

## 2017-03-23 DIAGNOSIS — H6983 Other specified disorders of Eustachian tube, bilateral: Secondary | ICD-10-CM | POA: Diagnosis not present

## 2017-03-23 DIAGNOSIS — H73891 Other specified disorders of tympanic membrane, right ear: Secondary | ICD-10-CM | POA: Diagnosis not present

## 2017-03-23 DIAGNOSIS — H903 Sensorineural hearing loss, bilateral: Secondary | ICD-10-CM | POA: Diagnosis not present

## 2017-03-28 DIAGNOSIS — M5416 Radiculopathy, lumbar region: Secondary | ICD-10-CM | POA: Diagnosis not present

## 2017-06-29 DIAGNOSIS — M4727 Other spondylosis with radiculopathy, lumbosacral region: Secondary | ICD-10-CM | POA: Diagnosis not present

## 2017-06-29 DIAGNOSIS — M5417 Radiculopathy, lumbosacral region: Secondary | ICD-10-CM | POA: Diagnosis not present

## 2017-10-10 DIAGNOSIS — H73001 Acute myringitis, right ear: Secondary | ICD-10-CM | POA: Diagnosis not present

## 2017-10-10 DIAGNOSIS — H7412 Adhesive left middle ear disease: Secondary | ICD-10-CM | POA: Diagnosis not present

## 2017-10-10 DIAGNOSIS — H60331 Swimmer's ear, right ear: Secondary | ICD-10-CM | POA: Diagnosis not present

## 2017-10-10 DIAGNOSIS — H73891 Other specified disorders of tympanic membrane, right ear: Secondary | ICD-10-CM | POA: Diagnosis not present

## 2017-10-10 DIAGNOSIS — H66001 Acute suppurative otitis media without spontaneous rupture of ear drum, right ear: Secondary | ICD-10-CM | POA: Diagnosis not present

## 2017-11-13 DIAGNOSIS — H6983 Other specified disorders of Eustachian tube, bilateral: Secondary | ICD-10-CM | POA: Diagnosis not present

## 2017-11-13 DIAGNOSIS — H9311 Tinnitus, right ear: Secondary | ICD-10-CM | POA: Diagnosis not present

## 2017-11-13 DIAGNOSIS — H7412 Adhesive left middle ear disease: Secondary | ICD-10-CM | POA: Diagnosis not present

## 2017-11-13 DIAGNOSIS — H906 Mixed conductive and sensorineural hearing loss, bilateral: Secondary | ICD-10-CM | POA: Diagnosis not present

## 2017-12-07 DIAGNOSIS — M4727 Other spondylosis with radiculopathy, lumbosacral region: Secondary | ICD-10-CM | POA: Diagnosis not present

## 2017-12-21 ENCOUNTER — Other Ambulatory Visit: Payer: Self-pay | Admitting: Pediatrics

## 2017-12-22 ENCOUNTER — Other Ambulatory Visit: Payer: Self-pay | Admitting: *Deleted

## 2017-12-22 DIAGNOSIS — K219 Gastro-esophageal reflux disease without esophagitis: Secondary | ICD-10-CM

## 2017-12-22 MED ORDER — PANTOPRAZOLE SODIUM 40 MG PO TBEC: 40.0000 mg | DELAYED_RELEASE_TABLET | Freq: Every day | ORAL | 0 refills | Status: DC

## 2017-12-22 NOTE — Telephone Encounter (Signed)
Last seen 01/18/17 MMM 

## 2017-12-22 NOTE — Telephone Encounter (Signed)
Refilled in a telephone encounter

## 2017-12-22 NOTE — Telephone Encounter (Signed)
[facility] pt has an appt in January wanted to know if he could get refill till then Refill sent to pharmacy

## 2018-01-08 DIAGNOSIS — H906 Mixed conductive and sensorineural hearing loss, bilateral: Secondary | ICD-10-CM | POA: Diagnosis not present

## 2018-01-08 DIAGNOSIS — H7412 Adhesive left middle ear disease: Secondary | ICD-10-CM | POA: Diagnosis not present

## 2018-01-08 DIAGNOSIS — H6983 Other specified disorders of Eustachian tube, bilateral: Secondary | ICD-10-CM | POA: Diagnosis not present

## 2018-01-08 DIAGNOSIS — H9311 Tinnitus, right ear: Secondary | ICD-10-CM | POA: Diagnosis not present

## 2018-01-15 ENCOUNTER — Other Ambulatory Visit: Payer: Self-pay | Admitting: Otolaryngology

## 2018-01-15 DIAGNOSIS — H7011 Chronic mastoiditis, right ear: Secondary | ICD-10-CM | POA: Diagnosis not present

## 2018-01-15 DIAGNOSIS — H7101 Cholesteatoma of attic, right ear: Secondary | ICD-10-CM | POA: Diagnosis not present

## 2018-01-15 DIAGNOSIS — H73891 Other specified disorders of tympanic membrane, right ear: Secondary | ICD-10-CM | POA: Diagnosis not present

## 2018-01-15 DIAGNOSIS — H906 Mixed conductive and sensorineural hearing loss, bilateral: Secondary | ICD-10-CM | POA: Diagnosis not present

## 2018-01-15 DIAGNOSIS — H6983 Other specified disorders of Eustachian tube, bilateral: Secondary | ICD-10-CM | POA: Diagnosis not present

## 2018-01-19 ENCOUNTER — Ambulatory Visit (INDEPENDENT_AMBULATORY_CARE_PROVIDER_SITE_OTHER): Payer: BLUE CROSS/BLUE SHIELD | Admitting: Nurse Practitioner

## 2018-01-19 ENCOUNTER — Encounter: Payer: Self-pay | Admitting: Nurse Practitioner

## 2018-01-19 VITALS — BP 127/91 | HR 97 | Temp 97.7°F | Ht 73.0 in | Wt 254.0 lb

## 2018-01-19 DIAGNOSIS — K219 Gastro-esophageal reflux disease without esophagitis: Secondary | ICD-10-CM | POA: Diagnosis not present

## 2018-01-19 DIAGNOSIS — Z125 Encounter for screening for malignant neoplasm of prostate: Secondary | ICD-10-CM | POA: Diagnosis not present

## 2018-01-19 DIAGNOSIS — Z0001 Encounter for general adult medical examination with abnormal findings: Secondary | ICD-10-CM | POA: Diagnosis not present

## 2018-01-19 DIAGNOSIS — Z0000 Encounter for general adult medical examination without abnormal findings: Secondary | ICD-10-CM | POA: Diagnosis not present

## 2018-01-19 DIAGNOSIS — L309 Dermatitis, unspecified: Secondary | ICD-10-CM

## 2018-01-19 DIAGNOSIS — E785 Hyperlipidemia, unspecified: Secondary | ICD-10-CM

## 2018-01-19 DIAGNOSIS — G8929 Other chronic pain: Secondary | ICD-10-CM

## 2018-01-19 DIAGNOSIS — F17200 Nicotine dependence, unspecified, uncomplicated: Secondary | ICD-10-CM

## 2018-01-19 DIAGNOSIS — Z6833 Body mass index (BMI) 33.0-33.9, adult: Secondary | ICD-10-CM

## 2018-01-19 DIAGNOSIS — Z86010 Personal history of colonic polyps: Secondary | ICD-10-CM

## 2018-01-19 DIAGNOSIS — M549 Dorsalgia, unspecified: Secondary | ICD-10-CM

## 2018-01-19 MED ORDER — ATORVASTATIN CALCIUM 40 MG PO TABS: 40.0000 mg | ORAL_TABLET | Freq: Every day | ORAL | 1 refills | Status: DC

## 2018-01-19 MED ORDER — PANTOPRAZOLE SODIUM 40 MG PO TBEC: 40.0000 mg | DELAYED_RELEASE_TABLET | Freq: Every day | ORAL | 0 refills | Status: DC

## 2018-01-19 MED ORDER — TRIAMCINOLONE ACETONIDE 0.1 % EX CREA: 1.0000 "application " | TOPICAL_CREAM | Freq: Two times a day (BID) | CUTANEOUS | 0 refills | Status: DC

## 2018-01-19 NOTE — Progress Notes (Signed)
Subjective:    Patient ID: [name] [name], male    DOB: 06/02/1965, 53 y.o.   MRN: [id number]   Chief Complaint: Annual Exam   HPI:  1. Annual physical exam   2. Gastroesophageal reflux disease without esophagitis  Is on a daily protonix. Is working well for him.  3. Hyperlipidemia with target LDL less than 100  Does not watch diet and does n o dedicated exercise.  4. BMI 33.0-33.9,adult  No recent weight changes  5. Chronic back pain, unspecified back location, unspecified back pain laterality Feels sore all the tome. He use tylenol OTC for the pain as needed   6. snuff  Has not had any in a week.     Outpatient Encounter Medications as of 01/19/2018  Medication Sig  . atorvastatin (LIPITOR) 40 MG tablet TAKE ONE TABLET BY MOUTH ONCE DAILY  . budesonide-formoterol (SYMBICORT) 160-4.5 MCG/ACT inhaler Inhale 2 puffs into the lungs as needed.  . fluticasone (FLONASE) 50 MCG/ACT nasal spray Place 2 sprays into both nostrils daily.  Marland Kitchen HYDROcodone-acetaminophen (NORCO) 10-325 MG tablet   . loratadine (CLARITIN) 10 MG tablet Take 1 tablet (10 mg total) by mouth daily.  . naproxen (NAPROSYN) 500 MG tablet TAKE ONE TABLET BY MOUTH TWICE DAILY WITH MEALS  . nystatin cream (MYCOSTATIN) Apply 1 application topically 3 (three) times daily.  . pantoprazole (PROTONIX) 40 MG tablet Take 1 tablet (40 mg total) by mouth daily.       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 Is hurting but overall doing ok  Social history: Out of work due to surgery. He will not go back until endof February.   Review of Systems  Constitutional: Negative for activity change and appetite change.  HENT: Positive for ear pain (from surgery).   Eyes: Negative for pain.  Respiratory: Negative for shortness of breath.   Cardiovascular: Negative for chest pain, palpitations and leg swelling.  Gastrointestinal: Negative for abdominal pain.  Endocrine: Negative for polydipsia.    Genitourinary: Negative.   Skin: Negative for rash.  Neurological: Negative for dizziness, weakness and headaches.  Hematological: Does not bruise/bleed easily.  Psychiatric/Behavioral: Negative.   All other systems reviewed and are negative.      Objective:   Physical Exam Vitals signs and nursing note reviewed.  Constitutional:      Appearance: Normal appearance. He is well-developed.  HENT:     Head: Normocephalic.     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 Incision behind right ear- no sign of infection.    Nose: Nose normal.  Eyes:     Pupils: Pupils are equal, round, and reactive to light.  Neck:     Musculoskeletal: Normal range of motion and neck supple.     Thyroid: No thyroid mass or thyromegaly.     Vascular: No carotid bruit or JVD.     Trachea: Phonation normal.  Cardiovascular:     Rate and Rhythm: Normal rate and regular rhythm.  Pulmonary:     Effort: Pulmonary effort is normal. No respiratory distress.     Breath sounds: Normal breath sounds.  Abdominal:     General: Bowel sounds are normal.     Palpations: Abdomen is soft.     Tenderness: There is no abdominal tenderness.  Musculoskeletal: Normal range of motion.  Lymphadenopathy:     Cervical: No cervical adenopathy.  Skin:    General: Skin is warm and dry.     Comments: Erythematous  dry patch on left upper chest wall.  Neurological:     Mental Status: He is alert and oriented to person, place, and time.  Psychiatric:        Behavior: Behavior normal.        Thought Content: Thought content normal.        Judgment: Judgment normal.       BP (!) 127/91   Pulse 97   Temp 97.7 F (36.5 C) (Oral)   Ht 6' 1" (1.854 m)   Wt 254 lb (115.2 kg)   BMI 33.51 kg/m      Assessment & Plan:  [name] [name] comes in today with chief complaint of Annual Exam   Diagnosis and orders addressed:  1. Annual physical exam - CBC with Differential/Platelet - Thyroid  Panel With TSH  2. Gastroesophageal reflux disease without esophagitis Avoid spicy foods Do not eat 2 hours prior to bedtime - pantoprazole (PROTONIX) 40 MG tablet; Take 1 tablet (40 mg total) by mouth daily.  Dispense: 30 tablet; Refill: 0  3. Hyperlipidemia with target LDL less than 100 Low fat diet - atorvastatin (LIPITOR) 40 MG tablet; Take 1 tablet (40 mg total) by mouth daily.  Dispense: 90 tablet; Refill: 1 - CMP14+EGFR - Lipid panel  4. BMI 33.0-33.9,adult Discussed diet and exercise for person with BMI >25 Will recheck weight in 3-6 months  5. Chronic back pain, unspecified back location, unspecified back pain laterality Moist heat rest  6. snuff Avoid snuff since have not had in over a week  7. Dermatitis Avoid scratching - triamcinolone cream (KENALOG) 0.1 %; Apply 1 application topically 2 (two) times daily.  Dispense: 453 g; Refill: 0  8. Prostate cancer screening - PSA, total and free  9. Hx of colonic polyps - Ambulatory referral to Gastroenterology   Labs pending Health Maintenance reviewed Diet and exercise encouraged  Follow up plan: 6 months   Mary-Margaret Hassell Done, FNP

## 2018-01-19 NOTE — Patient Instructions (Signed)

## 2018-01-20 LAB — CMP14+EGFR
ALT: 25 IU/L (ref 0–44)
AST: 15 IU/L (ref 0–40)
Albumin/Globulin Ratio: 1.6 (ref 1.2–2.2)
Albumin: 4 g/dL (ref 3.5–5.5)
Alkaline Phosphatase: 125 IU/L — ABNORMAL HIGH (ref 39–117)
BUN/Creatinine Ratio: 14 (ref 9–20)
BUN: 10 mg/dL (ref 6–24)
Bilirubin Total: 0.5 mg/dL (ref 0.0–1.2)
CO2: 23 mmol/L (ref 20–29)
Calcium: 9.1 mg/dL (ref 8.7–10.2)
Chloride: 101 mmol/L (ref 96–106)
Creatinine, Ser: 0.69 mg/dL — ABNORMAL LOW (ref 0.76–1.27)
GFR calc Af Amer: 128 mL/min/{1.73_m2} (ref >59)
GFR calc non Af Amer: 111 mL/min/{1.73_m2} (ref >59)
Globulin, Total: 2.5 g/dL (ref 1.5–4.5)
Glucose: 80 mg/dL (ref 65–99)
Potassium: 4.1 mmol/L (ref 3.5–5.2)
Sodium: 137 mmol/L (ref 134–144)
Total Protein: 6.5 g/dL (ref 6.0–8.5)

## 2018-01-20 LAB — LIPID PANEL
Chol/HDL Ratio: 5.6 ratio — ABNORMAL HIGH (ref 0.0–5.0)
Cholesterol, Total: 235 mg/dL — ABNORMAL HIGH (ref 100–199)
HDL: 42 mg/dL (ref >39)
LDL Calculated: 175 mg/dL — ABNORMAL HIGH (ref 0–99)
Triglycerides: 92 mg/dL (ref 0–149)
VLDL Cholesterol Cal: 18 mg/dL (ref 5–40)

## 2018-01-20 LAB — CBC WITH DIFFERENTIAL/PLATELET
Basophils Absolute: 0.1 10*3/uL (ref 0.0–0.2)
Basos: 1 %
EOS (ABSOLUTE): 0.2 10*3/uL (ref 0.0–0.4)
Eos: 2 %
Hematocrit: 49.2 % (ref 37.5–51.0)
Hemoglobin: 16.8 g/dL (ref 13.0–17.7)
Immature Grans (Abs): 0.2 10*3/uL — ABNORMAL HIGH (ref 0.0–0.1)
Immature Granulocytes: 2 %
Lymphocytes Absolute: 2.8 10*3/uL (ref 0.7–3.1)
Lymphs: 24 %
MCH: 33.7 pg — ABNORMAL HIGH (ref 26.6–33.0)
MCHC: 34.1 g/dL (ref 31.5–35.7)
MCV: 99 fL — ABNORMAL HIGH (ref 79–97)
Monocytes Absolute: 0.8 10*3/uL (ref 0.1–0.9)
Monocytes: 7 %
Neutrophils Absolute: 7.5 10*3/uL — ABNORMAL HIGH (ref 1.4–7.0)
Neutrophils: 64 %
Platelets: 242 10*3/uL (ref 150–450)
RBC: 4.98 x10E6/uL (ref 4.14–5.80)
RDW: 13.1 % (ref 12.3–15.4)
WBC: 11.5 10*3/uL — ABNORMAL HIGH (ref 3.4–10.8)

## 2018-01-20 LAB — THYROID PANEL WITH TSH
Free Thyroxine Index: 3.6 (ref 1.2–4.9)
T3 Uptake Ratio: 30 % (ref 24–39)
T4, Total: 12 ug/dL (ref 4.5–12.0)
TSH: 1.86 u[IU]/mL (ref 0.450–4.500)

## 2018-01-20 LAB — PSA, TOTAL AND FREE
PSA, Free Pct: 14.5 %
PSA, Free: 0.16 ng/mL
Prostate Specific Ag, Serum: 1.1 ng/mL (ref 0.0–4.0)

## 2018-03-08 DIAGNOSIS — M4727 Other spondylosis with radiculopathy, lumbosacral region: Secondary | ICD-10-CM | POA: Diagnosis not present

## 2018-04-03 DIAGNOSIS — H95111 Chronic inflammation of postmastoidectomy cavity, right ear: Secondary | ICD-10-CM | POA: Diagnosis not present

## 2018-04-03 DIAGNOSIS — H906 Mixed conductive and sensorineural hearing loss, bilateral: Secondary | ICD-10-CM | POA: Diagnosis not present

## 2018-04-03 DIAGNOSIS — H95121 Granulation of postmastoidectomy cavity, right ear: Secondary | ICD-10-CM | POA: Diagnosis not present

## 2018-04-03 DIAGNOSIS — H7412 Adhesive left middle ear disease: Secondary | ICD-10-CM | POA: Diagnosis not present

## 2018-04-18 DIAGNOSIS — H7412 Adhesive left middle ear disease: Secondary | ICD-10-CM | POA: Diagnosis not present

## 2018-04-18 DIAGNOSIS — H9311 Tinnitus, right ear: Secondary | ICD-10-CM | POA: Diagnosis not present

## 2018-04-18 DIAGNOSIS — H906 Mixed conductive and sensorineural hearing loss, bilateral: Secondary | ICD-10-CM | POA: Diagnosis not present

## 2018-04-18 DIAGNOSIS — H95111 Chronic inflammation of postmastoidectomy cavity, right ear: Secondary | ICD-10-CM | POA: Diagnosis not present

## 2018-06-05 DIAGNOSIS — M545 Low back pain: Secondary | ICD-10-CM | POA: Diagnosis not present

## 2018-07-06 ENCOUNTER — Other Ambulatory Visit: Payer: Self-pay

## 2018-07-06 ENCOUNTER — Other Ambulatory Visit: Payer: Self-pay | Admitting: Nurse Practitioner

## 2018-07-06 ENCOUNTER — Ambulatory Visit (INDEPENDENT_AMBULATORY_CARE_PROVIDER_SITE_OTHER): Payer: BC Managed Care – PPO | Admitting: Family Medicine

## 2018-07-06 DIAGNOSIS — K219 Gastro-esophageal reflux disease without esophagitis: Secondary | ICD-10-CM

## 2018-07-06 DIAGNOSIS — H1032 Unspecified acute conjunctivitis, left eye: Secondary | ICD-10-CM

## 2018-07-06 DIAGNOSIS — H00015 Hordeolum externum left lower eyelid: Secondary | ICD-10-CM

## 2018-07-06 MED ORDER — ERYTHROMYCIN 5 MG/GM OP OINT: 1.0000 "application " | TOPICAL_OINTMENT | Freq: Three times a day (TID) | OPHTHALMIC | 0 refills | Status: AC

## 2018-07-06 NOTE — Progress Notes (Signed)
Telephone visit  Subjective: CC: pink eye PCP: Chevis Pretty, FNP [name] [name] is a 53 y.o. male calls for telephone consult today. Patient provides verbal consent for consult held via phone.  Location of patient: home Location of provider: Working remotely from home Others present for call: none  1. Pink eye Patient reports a 1 day history of swelling along the left lower lid.  He can feel a palpable bump that is sore to touch underneath the left lid.  He reports associated redness of the left eye.  He has mild blurriness but this is relieved by applying a warm compress to the affected area.  Denies any pain with eye movement.  No double vision or loss of vision.  No other therapies used including other eyedrops.  He does report some drainage.  He notes he has these every couple of years and often will need an antibiotic to improve it.   ROS: Per HPI  Allergies  Allergen Reactions  . Sulfonamide Derivatives Rash  . Sertraline Hcl Nausea And Vomiting    Gi upset    Past Medical History:  Diagnosis Date  . DDD (degenerative disc disease)   . External hemorrhoid    and internal  . GERD (gastroesophageal reflux disease)   . Hyperlipidemia   . Spinal stenosis     Current Outpatient Medications:  .  atorvastatin (LIPITOR) 40 MG tablet, Take 1 tablet (40 mg total) by mouth daily., Disp: 90 tablet, Rfl: 1 .  fluticasone (FLONASE) 50 MCG/ACT nasal spray, Place 2 sprays into both nostrils daily., Disp: 16 g, Rfl: 6 .  HYDROcodone-acetaminophen (NORCO) 10-325 MG tablet, , Disp: , Rfl:  .  loratadine (CLARITIN) 10 MG tablet, Take 1 tablet (10 mg total) by mouth daily., Disp: 30 tablet, Rfl: 5 .  naproxen (NAPROSYN) 500 MG tablet, TAKE ONE TABLET BY MOUTH TWICE DAILY WITH MEALS, Disp: 60 tablet, Rfl: 0 .  nystatin cream (MYCOSTATIN), Apply 1 application topically 3 (three) times daily., Disp: 30 g, Rfl: 1 .  pantoprazole (PROTONIX) 40 MG tablet, Take 1 tablet (40 mg  total) by mouth daily., Disp: 30 tablet, Rfl: 0 .  triamcinolone cream (KENALOG) 0.1 %, Apply 1 application topically 2 (two) times daily., Disp: 453 g, Rfl: 0  Assessment/ Plan: 53 y.o. male   1. Acute conjunctivitis of left eye, unspecified acute conjunctivitis type Home care instructions reviewed with the patient.  Continue warm compress.  We discussed red flag signs and symptoms.  Patient was good understanding of follow-up PRN - erythromycin ophthalmic ointment; Place 1 application into the left eye 3 (three) times daily for 5 days.  Dispense: 3.5 g; Refill: 0  2. Hordeolum externum of left lower eyelid - erythromycin ophthalmic ointment; Place 1 application into the left eye 3 (three) times daily for 5 days.  Dispense: 3.5 g; Refill: 0   Start time: 10:07am End time: 10:11am  Total time spent on patient care (including telephone call/ virtual visit): 10 minutes  [name], [name] [phone]

## 2018-07-06 NOTE — Patient Instructions (Signed)

## 2018-07-19 ENCOUNTER — Ambulatory Visit: Payer: BLUE CROSS/BLUE SHIELD | Admitting: Nurse Practitioner

## 2018-07-19 ENCOUNTER — Encounter: Payer: Self-pay | Admitting: Nurse Practitioner

## 2018-07-19 ENCOUNTER — Other Ambulatory Visit: Payer: Self-pay

## 2018-07-19 VITALS — BP 124/86 | HR 85 | Temp 97.7°F | Ht 73.0 in | Wt 262.0 lb

## 2018-07-19 DIAGNOSIS — G8929 Other chronic pain: Secondary | ICD-10-CM

## 2018-07-19 DIAGNOSIS — K219 Gastro-esophageal reflux disease without esophagitis: Secondary | ICD-10-CM

## 2018-07-19 DIAGNOSIS — Z1211 Encounter for screening for malignant neoplasm of colon: Secondary | ICD-10-CM

## 2018-07-19 DIAGNOSIS — Z6834 Body mass index (BMI) 34.0-34.9, adult: Secondary | ICD-10-CM

## 2018-07-19 DIAGNOSIS — M549 Dorsalgia, unspecified: Secondary | ICD-10-CM

## 2018-07-19 DIAGNOSIS — E785 Hyperlipidemia, unspecified: Secondary | ICD-10-CM | POA: Diagnosis not present

## 2018-07-19 LAB — CMP14+EGFR
ALT: 18 IU/L (ref 0–44)
AST: 11 IU/L (ref 0–40)
Albumin/Globulin Ratio: 1.5 (ref 1.2–2.2)
Albumin: 3.8 g/dL — ABNORMAL LOW (ref 4.0–5.0)
Alkaline Phosphatase: 155 IU/L — ABNORMAL HIGH (ref 39–117)
BUN/Creatinine Ratio: 10 (ref 9–20)
BUN: 9 mg/dL (ref 6–24)
Bilirubin Total: 0.6 mg/dL (ref 0.0–1.2)
CO2: 24 mmol/L (ref 20–29)
Calcium: 9 mg/dL (ref 8.7–10.2)
Chloride: 103 mmol/L (ref 96–106)
Creatinine, Ser: 0.94 mg/dL (ref 0.76–1.27)
GFR calc Af Amer: 109 mL/min/{1.73_m2} (ref >59)
GFR calc non Af Amer: 94 mL/min/{1.73_m2} (ref >59)
Globulin, Total: 2.6 g/dL (ref 1.5–4.5)
Glucose: 100 mg/dL — ABNORMAL HIGH (ref 65–99)
Potassium: 4.4 mmol/L (ref 3.5–5.2)
Sodium: 140 mmol/L (ref 134–144)
Total Protein: 6.4 g/dL (ref 6.0–8.5)

## 2018-07-19 LAB — LIPID PANEL
Chol/HDL Ratio: 4.7 ratio (ref 0.0–5.0)
Cholesterol, Total: 156 mg/dL (ref 100–199)
HDL: 33 mg/dL — ABNORMAL LOW (ref >39)
LDL Calculated: 109 mg/dL — ABNORMAL HIGH (ref 0–99)
Triglycerides: 70 mg/dL (ref 0–149)
VLDL Cholesterol Cal: 14 mg/dL (ref 5–40)

## 2018-07-19 MED ORDER — ATORVASTATIN CALCIUM 40 MG PO TABS: 40.0000 mg | ORAL_TABLET | Freq: Every day | ORAL | 1 refills | Status: DC

## 2018-07-19 MED ORDER — PANTOPRAZOLE SODIUM 40 MG PO TBEC: 40.0000 mg | DELAYED_RELEASE_TABLET | Freq: Every day | ORAL | 1 refills | Status: DC

## 2018-07-19 NOTE — Patient Instructions (Signed)

## 2018-07-19 NOTE — Progress Notes (Signed)
Subjective:    Patient ID: [name] [name], male    DOB: [birth year], 53 y.o.   MRN: [id number]   Chief Complaint: Medical Management of Chronic Issues    HPI:  1. Hyperlipidemia with target LDL less than 100 Does not watch diet and does no exercise  2. Gastroesophageal reflux disease without esophagitis Takes daily protonix and works well to keep symptoms under control  3. Chronic back pain, unspecified back location, unspecified back pain laterality Still has back pain but takes tylenol and ibuprofen OTC.  4. BMI 33.0-33.9,adult Weight is up 8lbs from previous visit    Outpatient Encounter Medications as of 07/19/2018  Medication Sig  . atorvastatin (LIPITOR) 40 MG tablet Take 1 tablet (40 mg total) by mouth daily.  . fluticasone (FLONASE) 50 MCG/ACT nasal spray Place 2 sprays into both nostrils daily.  Marland Kitchen HYDROcodone-acetaminophen (NORCO) 10-325 MG tablet   . loratadine (CLARITIN) 10 MG tablet Take 1 tablet (10 mg total) by mouth daily.  . naproxen (NAPROSYN) 500 MG tablet TAKE ONE TABLET BY MOUTH TWICE DAILY WITH MEALS  . nystatin cream (MYCOSTATIN) Apply 1 application topically 3 (three) times daily.  . pantoprazole (PROTONIX) 40 MG tablet Take 1 tablet by mouth once daily  . triamcinolone cream (KENALOG) 0.1 % Apply 1 application topically 2 (two) times daily.    Past Surgical History:  Procedure Laterality Date  . [place]    History reviewed. No pertinent family history.  New complaints: Got hurt at work- injured left knee-   Social history: Works Surveyor, [employer]     Review of Systems  Constitutional: Negative for activity change and appetite change.  HENT: Negative.   Eyes: Negative for pain.  Respiratory: Negative for shortness of breath.   Cardiovascular: Negative for chest pain, palpitations and leg swelling.  Gastrointestinal: Negative for abdominal pain.  Endocrine: Negative for polydipsia.  Genitourinary:  Negative.   Skin: Negative for rash.  Neurological: Negative for dizziness, weakness and headaches.  Hematological: Does not bruise/bleed easily.  Psychiatric/Behavioral: Negative.   All other systems reviewed and are negative.      Objective:   Physical Exam Vitals signs and nursing note reviewed.  Constitutional:      Appearance: Normal appearance. He is well-developed.  HENT:     Head: Normocephalic.     Nose: Nose normal.  Eyes:     Pupils: Pupils are equal, round, and reactive to light.  Neck:     Musculoskeletal: Normal range of motion and neck supple.     Thyroid: No thyroid mass or thyromegaly.     Vascular: No carotid bruit or JVD.     Trachea: Phonation normal.  Cardiovascular:     Rate and Rhythm: Normal rate and regular rhythm.  Pulmonary:     Effort: Pulmonary effort is normal. No respiratory distress.     Breath sounds: Normal breath sounds.  Abdominal:     General: Bowel sounds are normal.     Palpations: Abdomen is soft.     Tenderness: There is no abdominal tenderness.  Musculoskeletal: Normal range of motion.     Comments: Referred knee assessment to ortho  Lymphadenopathy:     Cervical: No cervical adenopathy.  Skin:    General: Skin is warm and dry.  Neurological:     Mental Status: He is alert and oriented to person, place, and time.  Psychiatric:        Behavior: Behavior normal.  Thought Content: Thought content normal.        Judgment: Judgment normal.       BP 124/86   Pulse 85   Temp 97.7 F (36.5 C) (Oral)   Ht _0  (1.854 m)   Wt 262 lb (118.8 kg)   BMI 34.57 kg/m      Assessment & Plan:  BAIRON KLEMANN comes in today with chief complaint of Medical Management of Chronic Issues   Diagnosis and orders addressed:  1. Hyperlipidemia with target LDL less than 100 Lowfat diet encouraged - atorvastatin (LIPITOR) 40 MG tablet; Take 1 tablet (40 mg total) by mouth daily.  Dispense: 90 tablet; Refill: 1 - CMP14+EGFR -  Lipid panel  2. Gastroesophageal reflux disease without esophagitis Avoid spicy foods Do not eat 2 hours prior to bedtime - pantoprazole (PROTONIX) 40 MG tablet; Take 1 tablet (40 mg total) by mouth daily.  Dispense: 90 tablet; Refill: 1  3. Chronic back pain, unspecified back location, unspecified back pain laterality Back stretches  4. BMI 34.0-34.9,adult Discussed diet and exercise for person with BMI >25 Will recheck weight in 3-6 months    Labs pending Health Maintenance reviewed Diet and exercise encouraged  Follow up plan: 6 months   Mary-Margaret Hassell Done, FNP

## 2018-08-01 ENCOUNTER — Encounter: Payer: Self-pay | Admitting: Gastroenterology

## 2018-09-06 DIAGNOSIS — M4727 Other spondylosis with radiculopathy, lumbosacral region: Secondary | ICD-10-CM | POA: Diagnosis not present

## 2018-09-13 ENCOUNTER — Ambulatory Visit (INDEPENDENT_AMBULATORY_CARE_PROVIDER_SITE_OTHER): Payer: BC Managed Care – PPO | Admitting: *Deleted

## 2018-09-13 ENCOUNTER — Other Ambulatory Visit: Payer: Self-pay

## 2018-09-13 DIAGNOSIS — Z1211 Encounter for screening for malignant neoplasm of colon: Secondary | ICD-10-CM

## 2018-09-13 MED ORDER — PEG 3350-KCL-NA BICARB-NACL 420 G PO SOLR: 4000.0000 mL | Freq: Once | ORAL | 0 refills | Status: AC

## 2018-09-13 NOTE — Patient Instructions (Signed)
[name] [name]   10/10/1965 MRN: [id number]    Procedure Date: 11/09/2018 Time to register: 11:00 am Place to register: [facility] Procedure Time: 12:00 pm Scheduled provider: Dr. Gala Romney  PREPARATION FOR COLONOSCOPY WITH TRI-LYTE SPLIT PREP  Please notify us immediately if you are diabetic, take iron supplements, or if you are on Coumadin or any other blood thinners.    You will need to purchase 1 fleet enema and 1 box of Bisacodyl '5mg'$  tablets.   2 DAYS BEFORE PROCEDURE:  DATE: 11/07/2018  DAY: Wednesday Begin clear liquid diet AFTER your lunch meal. NO SOLID FOODS after this point.  1 DAY BEFORE PROCEDURE:  DATE: 11/08/2018  DAY: Thursday Continue clear liquids the entire day - NO SOLID FOOD.    At 2:00 pm:  Take 2 Bisacodyl tablets.   At 4:00pm:  Start drinking your solution. Make sure you mix well per instructions on the bottle. Try to drink 1 (one) 8 ounce glass every 10-15 minutes until you have consumed HALF the jug. You should complete by 6:00pm.You must keep the left over solution refrigerated until completed next day.  Continue clear liquids. You must drink plenty of clear liquids to prevent dehyration and kidney failure.     DAY OF PROCEDURE:   DATE: 11/09/2018  DAY: Friday If you take medications for your heart, blood pressure or breathing, you may take these medications.    Five hours before your procedure time @ 7:00 am:  Finish remaining amout of bowel prep, drinking 1 (one) 8 ounce glass every 10-15 minutes until complete. You have two hours to consume remaining prep.   Three hours before your procedure time @ 9:00 am:  Nothing by mouth.   At least one hour before going to the hospital:  Give yourself one Fleet enema. You may take your morning medications with sip of water unless we have instructed otherwise.      Please see below for Dietary Information.  CLEAR LIQUIDS INCLUDE:  Water Jello (NOT red in color)   Ice Popsicles (NOT red in  color)   Tea (sugar ok, no milk/cream) Powdered fruit flavored drinks  Coffee (sugar ok, no milk/cream) Gatorade/ Lemonade/ Kool-Aid  (NOT red in color)   Juice: apple, white grape, white cranberry Soft drinks  Clear bullion, consomme, broth (fat free beef/chicken/vegetable)  Carbonated beverages (any kind)  Strained chicken noodle soup Hard Candy   Remember: Clear liquids are liquids that will allow you to see your fingers on the other side of a clear glass. Be sure liquids are NOT red in color, and not cloudy, but CLEAR.  DO NOT EAT OR DRINK ANY OF THE FOLLOWING:  Dairy products of any kind   Cranberry juice Tomato juice / V8 juice   Grapefruit juice Orange juice     Red grape juice  Do not eat any solid foods, including such foods as: cereal, oatmeal, yogurt, fruits, vegetables, creamed soups, eggs, bread, crackers, pureed foods in a blender, etc.   HELPFUL HINTS FOR DRINKING PREP SOLUTION:   Make sure prep is extremely cold. Mix and refrigerate the the morning of the prep. You may also put in the freezer.   You may try mixing some Crystal Light or Country Time Lemonade if you prefer. Mix in small amounts; add more if necessary.  Try drinking through a straw  Rinse mouth with water or a mouthwash between glasses, to remove after-taste.  Try sipping on a cold beverage /ice/ popsicles between glasses of  prep.  Place a piece of sugar-free hard candy in mouth between glasses.  If you become nauseated, try consuming smaller amounts, or stretch out the time between glasses. Stop for 30-60 minutes, then slowly start back drinking.        OTHER INSTRUCTIONS  You will need a responsible adult at least 51 years of age to accompany you and drive you home. This person must remain in the waiting room during your procedure. The hospital will cancel your procedure if you do not have a responsible adult with you.   1. Wear loose fitting clothing that is easily removed. 2. Leave jewelry  and other valuables at home.  3. Remove all body piercing jewelry and leave at home. 4. Total time from sign-in until discharge is approximately 2-3 hours. 5. You should go home directly after your procedure and rest. You can resume normal activities the day after your procedure. 6. The day of your procedure you should not:  Drive  Make legal decisions  Operate machinery  Drink alcohol  Return to work   You may call the office (Dept: [phone]) before 5:00pm, or page the doctor on call [phone]) after 5:00pm, for further instructions, if necessary.   Insurance Information YOU WILL NEED TO CHECK WITH YOUR INSURANCE COMPANY FOR THE BENEFITS OF COVERAGE YOU HAVE FOR THIS PROCEDURE.  UNFORTUNATELY, NOT ALL INSURANCE COMPANIES HAVE BENEFITS TO COVER ALL OR PART OF THESE TYPES OF PROCEDURES.  IT IS YOUR RESPONSIBILITY TO CHECK YOUR BENEFITS, HOWEVER, WE WILL BE GLAD TO ASSIST YOU WITH ANY CODES YOUR INSURANCE COMPANY MAY NEED.    PLEASE NOTE THAT MOST INSURANCE COMPANIES WILL NOT COVER A SCREENING COLONOSCOPY FOR PEOPLE UNDER THE AGE OF 50  IF YOU HAVE BCBS INSURANCE, YOU MAY HAVE BENEFITS FOR A SCREENING COLONOSCOPY BUT IF POLYPS ARE FOUND THE DIAGNOSIS WILL CHANGE AND THEN YOU MAY HAVE A DEDUCTIBLE THAT WILL NEED TO BE MET. SO PLEASE MAKE SURE YOU CHECK YOUR BENEFITS FOR A SCREENING COLONOSCOPY AS WELL AS A DIAGNOSTIC COLONOSCOPY.

## 2018-09-13 NOTE — Progress Notes (Addendum)
Gastroenterology Pre-Procedure Review  Request Date: 09/13/2018 Requesting Physician: Last TCS done in [place] years ago, pt can not remember who performed it or when it was done  PATIENT REVIEW QUESTIONS: The patient responded to the following health history questions as indicated:    1. Diabetes Melitis: No 2. Joint replacements in the past 12 months: No 3. Major health problems in the past 3 months: Yes, torn meniscus and MCL 4. Has an artificial valve or MVP: No 5. Has a defibrillator: No 6. Has been advised in past to take antibiotics in advance of a procedure like teeth cleaning: No 7. Family history of colon cancer: No  8. Alcohol Use: No 9. History of sleep apnea: No  10. History of coronary artery or other vascular stents placed within the last 12 months: No 11. History of any prior anesthesia complications: No    MEDICATIONS & ALLERGIES:    Patient reports the following regarding taking any blood thinners:   Plavix? No Aspirin? No Coumadin? No Brilinta? No Xarelto? No Eliquis? No Pradaxa? No Savaysa? No Effient? No  Patient confirms/reports the following medications:  Current Outpatient Medications  Medication Sig Dispense Refill  . atorvastatin (LIPITOR) 40 MG tablet Take 1 tablet (40 mg total) by mouth daily. 90 tablet 1  . loratadine (CLARITIN) 10 MG tablet Take 1 tablet (10 mg total) by mouth daily. 30 tablet 5  . pantoprazole (PROTONIX) 40 MG tablet Take 1 tablet (40 mg total) by mouth daily. 90 tablet 1  . triamcinolone cream (KENALOG) 0.1 % Apply 1 application topically 2 (two) times daily. 453 g 0   No current facility-administered medications for this visit.     Patient confirms/reports the following allergies:  Allergies  Allergen Reactions  . Sulfa Antibiotics Rash  . Sulfonamide Derivatives Rash  . Sertraline Hcl Nausea And Vomiting    Gi upset     No orders of the defined types were placed in this encounter.   AUTHORIZATION INFORMATION Primary  Insurance: BC[employer],  [place] #: [id number],  Group #: [id number] Pre-Cert / Auth required: Yes, approved per Sam from [id number] Pre-Cert/Auth #: [id number]  SCHEDULE INFORMATION: Procedure has been scheduled as follows:  Date: 11/09/2018, Time: 12:00 Location: [facility] with Dr. Gala Romney  This Gastroenterology Pre-Precedure Review Form is being routed to the following provider(s): Neil Crouch, PA-C

## 2018-09-14 NOTE — Progress Notes (Signed)
Ok to schedule.

## 2018-09-18 NOTE — Addendum Note (Signed)
Addended by: Metro Kung on: 09/18/2018 07:53 AM   Modules accepted: Orders, SmartSet

## 2018-09-25 ENCOUNTER — Other Ambulatory Visit: Payer: Self-pay | Admitting: *Deleted

## 2018-09-25 DIAGNOSIS — Z20822 Contact with and (suspected) exposure to covid-19: Secondary | ICD-10-CM

## 2018-09-25 DIAGNOSIS — R6889 Other general symptoms and signs: Secondary | ICD-10-CM | POA: Diagnosis not present

## 2018-09-26 LAB — NOVEL CORONAVIRUS, NAA: SARS-CoV-2, NAA: NOT DETECTED

## 2018-11-07 ENCOUNTER — Other Ambulatory Visit (HOSPITAL_COMMUNITY)
Admission: RE | Admit: 2018-11-07 | Discharge: 2018-11-07 | Disposition: A | Discharge status: Home / Self Care | Payer: BC Managed Care – PPO | Source: Ambulatory Visit | Attending: Internal Medicine | Admitting: Internal Medicine

## 2018-11-07 ENCOUNTER — Other Ambulatory Visit: Payer: Self-pay

## 2018-11-07 DIAGNOSIS — Z20828 Contact with and (suspected) exposure to other viral communicable diseases: Secondary | ICD-10-CM | POA: Insufficient documentation

## 2018-11-07 DIAGNOSIS — Z01812 Encounter for preprocedural laboratory examination: Secondary | ICD-10-CM | POA: Diagnosis not present

## 2018-11-07 LAB — SARS CORONAVIRUS 2 (TAT 6-24 HRS): SARS Coronavirus 2: NEGATIVE

## 2018-11-08 ENCOUNTER — Telehealth: Payer: Self-pay | Admitting: *Deleted

## 2018-11-08 ENCOUNTER — Encounter: Payer: Self-pay | Admitting: *Deleted

## 2018-11-08 NOTE — Telephone Encounter (Addendum)
Spoke with pt and he needed prep instructions.  He said he wasn't sure if he received them or if he misplaced them.  I informed him that I would send them to his my chart account. I also reminded pt to have someone to pick him up a fleet enema and Bisacodyl tablets.  Sent them and verified with pt that he did see them after I sent them.

## 2018-11-09 ENCOUNTER — Encounter (HOSPITAL_COMMUNITY)
Admission: RE | Disposition: A | Discharge status: Home / Self Care | Payer: Self-pay | Source: Home / Self Care | Attending: Internal Medicine

## 2018-11-09 ENCOUNTER — Ambulatory Visit (HOSPITAL_COMMUNITY)
Admission: RE | Admit: 2018-11-09 | Discharge: 2018-11-09 | Disposition: A | Discharge status: Home / Self Care | Payer: BC Managed Care – PPO | Attending: Internal Medicine | Admitting: Internal Medicine

## 2018-11-09 ENCOUNTER — Other Ambulatory Visit: Payer: Self-pay

## 2018-11-09 ENCOUNTER — Encounter (HOSPITAL_COMMUNITY): Payer: Self-pay | Admitting: *Deleted

## 2018-11-09 DIAGNOSIS — Z1211 Encounter for screening for malignant neoplasm of colon: Secondary | ICD-10-CM | POA: Diagnosis not present

## 2018-11-09 DIAGNOSIS — K219 Gastro-esophageal reflux disease without esophagitis: Secondary | ICD-10-CM | POA: Diagnosis not present

## 2018-11-09 DIAGNOSIS — Z800 Family history of malignant neoplasm of digestive organs: Secondary | ICD-10-CM | POA: Insufficient documentation

## 2018-11-09 DIAGNOSIS — K640 First degree hemorrhoids: Secondary | ICD-10-CM | POA: Diagnosis not present

## 2018-11-09 DIAGNOSIS — Z86010 Personal history of colonic polyps: Secondary | ICD-10-CM | POA: Diagnosis not present

## 2018-11-09 DIAGNOSIS — E785 Hyperlipidemia, unspecified: Secondary | ICD-10-CM | POA: Insufficient documentation

## 2018-11-09 DIAGNOSIS — Z79899 Other long term (current) drug therapy: Secondary | ICD-10-CM | POA: Insufficient documentation

## 2018-11-09 DIAGNOSIS — K625 Hemorrhage of anus and rectum: Secondary | ICD-10-CM | POA: Diagnosis not present

## 2018-11-09 DIAGNOSIS — Z87891 Personal history of nicotine dependence: Secondary | ICD-10-CM | POA: Insufficient documentation

## 2018-11-09 DIAGNOSIS — K5730 Diverticulosis of large intestine without perforation or abscess without bleeding: Secondary | ICD-10-CM | POA: Diagnosis not present

## 2018-11-09 HISTORY — DX: Other intervertebral disc degeneration, lumbar region: M51.36

## 2018-11-09 HISTORY — DX: Other intervertebral disc degeneration, lumbar region without mention of lumbar back pain or lower extremity pain: M51.369

## 2018-11-09 HISTORY — PX: COLONOSCOPY: SHX5424

## 2018-11-09 SURGERY — COLONOSCOPY
Anesthesia: Moderate Sedation

## 2018-11-09 MED ORDER — MEPERIDINE HCL 50 MG/ML IJ SOLN
INTRAMUSCULAR | Status: AC
  Filled 2018-11-09: qty 1

## 2018-11-09 MED ORDER — SODIUM CHLORIDE 0.9 % IV SOLN
INTRAVENOUS | Status: DC
  Administered 2018-11-09: 11:00:00 via INTRAVENOUS

## 2018-11-09 MED ORDER — MEPERIDINE HCL 100 MG/ML IJ SOLN
INTRAMUSCULAR | Status: DC | PRN
  Administered 2018-11-09: 25 mg

## 2018-11-09 MED ORDER — MIDAZOLAM HCL 5 MG/5ML IJ SOLN
INTRAMUSCULAR | Status: AC
  Filled 2018-11-09: qty 10

## 2018-11-09 MED ORDER — MIDAZOLAM HCL 5 MG/5ML IJ SOLN
INTRAMUSCULAR | Status: DC | PRN
  Administered 2018-11-09: 1 mg via INTRAVENOUS
  Administered 2018-11-09 (×2): 2 mg via INTRAVENOUS
  Administered 2018-11-09: 1 mg via INTRAVENOUS

## 2018-11-09 MED ORDER — ONDANSETRON HCL 4 MG/2ML IJ SOLN
INTRAMUSCULAR | Status: DC | PRN
  Administered 2018-11-09: 4 mg via INTRAVENOUS

## 2018-11-09 MED ORDER — ONDANSETRON HCL 4 MG/2ML IJ SOLN
INTRAMUSCULAR | Status: AC
  Filled 2018-11-09: qty 2

## 2018-11-09 NOTE — H&P (Signed)
@LOGO @   Primary Care Physician:  Chevis Pretty, FNP Primary Gastroenterologist:  Dr. Gala Romney  Pre-Procedure History & Physical: HPI:  [name] [name] is a 53 y.o. male is here for a screening colonoscopy.  States schedule a distant history of colon polyps.  Does endorse intermittent rectal bleeding.  Family history of colon cancer.  Past Medical History:  Diagnosis Date  . DDD (degenerative disc disease)   . External hemorrhoid    and internal  . GERD (gastroesophageal reflux disease)   . Hyperlipidemia   . Spinal stenosis     Past Surgical History:  Procedure Laterality Date  . CHOLESTEATOMA EXCISION  1994    Prior to Admission medications   Medication Sig Start Date End Date Taking? Authorizing Provider  atorvastatin (LIPITOR) 40 MG tablet Take 1 tablet (40 mg total) by mouth daily. Patient taking differently: Take 40 mg by mouth at bedtime.  07/19/18  Yes Martin, Mary-Margaret, FNP  cetirizine (ZYRTEC) 10 MG tablet Take 10 mg by mouth at bedtime.   Yes [provider]  HYDROcodone-acetaminophen (NORCO) 10-325 MG tablet Take 1 tablet by mouth every 8 (eight) hours as needed for pain. 09/07/18  Yes [provider]  ibuprofen (ADVIL) 200 MG tablet Take 800-1,200 mg by mouth every 8 (eight) hours as needed (for pain.).   Yes [provider]  pantoprazole (PROTONIX) 40 MG tablet Take 1 tablet (40 mg total) by mouth daily. Patient taking differently: Take 40 mg by mouth at bedtime.  07/19/18  Yes Hassell Done, Mary-Margaret, FNP    Allergies as of 09/18/2018 - Review Complete 09/13/2018  Allergen Reaction Noted  . Sulfa antibiotics Rash 02/09/2010  . Sulfonamide derivatives Rash 02/09/2010  . Sertraline hcl Nausea And Vomiting 05/07/2010    No family history on file.  Social History   Socioeconomic History  . Marital status: Married    Spouse name: Not on file  . Number of children: Not on file  . Years of education: Not on file  . Highest  education level: Not on file  Occupational History  . Not on file  Social Needs  . Financial resource strain: Not on file  . Food insecurity    Worry: Not on file    Inability: Not on file  . Transportation needs    Medical: Not on file    Non-medical: Not on file  Tobacco Use  . Smoking status: Former Smoker    Packs/day: 0.50    Types: Cigarettes    Quit date: 11/18/2015    Years since quitting: 2.9  . Smokeless tobacco: Current User    Types: Snuff, Chew  Substance and Sexual Activity  . Alcohol use: No  . Drug use: No  . Sexual activity: Not on file  Lifestyle  . Physical activity    Days per week: Not on file    Minutes per session: Not on file  . Stress: Not on file  Relationships  . Social Herbalist on phone: Not on file    Gets together: Not on file    Attends religious service: Not on file    Active member of club or organization: Not on file    Attends meetings of clubs or organizations: Not on file    Relationship status: Not on file  . Intimate partner violence    Fear of current or ex partner: Not on file    Emotionally abused: Not on file    Physically abused: Not on file  Forced sexual activity: Not on file  Other Topics Concern  . Not on file  Social History Narrative  . Not on file    Review of Systems: See HPI, otherwise negative ROS  Physical Exam: There were no vitals taken for this visit. General:   Alert,  Well-developed, well-nourished, pleasant and cooperative in NAD Neck:  Supple; no masses or thyromegaly. Lungs:  Clear throughout to auscultation.   No wheezes, crackles, or rhonchi. No acute distress. Heart:  Regular rate and rhythm; no murmurs, clicks, rubs,  or gallops. Abdomen:  Soft, nontender and nondistended. No masses, hepatosplenomegaly or hernias noted. Normal bowel sounds, without guarding, and without rebound.   ct.  Impression/Plan: [name] [name] is now here to undergo a s colonoscopy.  Intermittent  rectal bleeding.  Reports distant history of colonic polyps. Risks, benefits, limitations, imponderables and alternatives regarding colonoscopy have been reviewed with the patient. Questions have been answered. All parties agreeable.     Notice:  This dictation was prepared with Dragon dictation along with smaller phrase technology. Any transcriptional errors that result from this process are unintentional and may not be corrected upon review.

## 2018-11-09 NOTE — Discharge Instructions (Signed)
Colonoscopy Discharge Instructions  Read the instructions outlined below and refer to this sheet in the next few weeks. These discharge instructions provide you with general information on caring for yourself after you leave the hospital. Your doctor may also give you specific instructions. While your treatment has been planned according to the most current medical practices available, unavoidable complications occasionally occur. If you have any problems or questions after discharge, call Dr. Gala Romney at [phone]. ACTIVITY  You may resume your regular activity, but move at a slower pace for the next 24 hours.   Take frequent rest periods for the next 24 hours.   Walking will help get rid of the air and reduce the bloated feeling in your belly (abdomen).   No driving for 24 hours (because of the medicine (anesthesia) used during the test).    Do not sign any important legal documents or operate any machinery for 24 hours (because of the anesthesia used during the test).  NUTRITION  Drink plenty of fluids.   You may resume your normal diet as instructed by your doctor.   Begin with a light meal and progress to your normal diet. Heavy or fried foods are harder to digest and may make you feel sick to your stomach (nauseated).   Avoid alcoholic beverages for 24 hours or as instructed.  MEDICATIONS  You may resume your normal medications unless your doctor tells you otherwise.  WHAT YOU CAN EXPECT TODAY  Some feelings of bloating in the abdomen.   Passage of more gas than usual.   Spotting of blood in your stool or on the toilet paper.  IF YOU HAD POLYPS REMOVED DURING THE COLONOSCOPY:  No aspirin products for 7 days or as instructed.   No alcohol for 7 days or as instructed.   Eat a soft diet for the next 24 hours.  FINDING OUT THE RESULTS OF YOUR TEST Not all test results are available during your visit. If your test results are not back during the visit, make an appointment  with your caregiver to find out the results. Do not assume everything is normal if you have not heard from your caregiver or the medical facility. It is important for you to follow up on all of your test results.  SEEK IMMEDIATE MEDICAL ATTENTION IF:  You have more than a spotting of blood in your stool.   Your belly is swollen (abdominal distention).   You are nauseated or vomiting.   You have a temperature over 101.   You have abdominal pain or discomfort that is severe or gets worse throughout the day.    Hemorrhoids Hemorrhoids are swollen veins that may develop:  In the butt (rectum). These are called internal hemorrhoids.  Around the opening of the butt (anus). These are called external hemorrhoids. Hemorrhoids can cause pain, itching, or bleeding. Most of the time, they do not cause serious problems. They usually get better with diet changes, lifestyle changes, and other home treatments. What are the causes? This condition may be caused by:  Having trouble pooping (constipation).  Pushing hard (straining) to poop.  Watery poop (diarrhea).  Pregnancy.  Being very overweight (obese).  Sitting for long periods of time.  Heavy lifting or other activity that causes you to strain.  Anal sex.  Riding a bike for a long period of time. What are the signs or symptoms? Symptoms of this condition include:  Pain.  Itching or soreness in the butt.  Bleeding from the butt.  Leaking poop.  Swelling in the area.  One or more lumps around the opening of your butt. How is this diagnosed? A doctor can often diagnose this condition by looking at the affected area. The doctor may also:  Do an exam that involves feeling the area with a gloved hand (digital rectal exam).  Examine the area inside your butt using a small tube (anoscope).  Order blood tests. This may be done if you have lost a lot of blood.  Have you get a test that involves looking inside the colon using  a flexible tube with a camera on the end (sigmoidoscopy or colonoscopy). How is this treated? This condition can usually be treated at home. Your doctor may tell you to change what you eat, make lifestyle changes, or try home treatments. If these do not help, procedures can be done to remove the hemorrhoids or make them smaller. These may involve:  Placing rubber bands at the base of the hemorrhoids to cut off their blood supply.  Injecting medicine into the hemorrhoids to shrink them.  Shining a type of light energy onto the hemorrhoids to cause them to fall off.  Doing surgery to remove the hemorrhoids or cut off their blood supply. Follow these instructions at home: Eating and drinking   Eat foods that have a lot of fiber in them. These include whole grains, beans, nuts, fruits, and vegetables.  Ask your doctor about taking products that have added fiber (fibersupplements).  Reduce the amount of fat in your diet. You can do this by: ? Eating low-fat dairy products. ? Eating less red meat. ? Avoiding processed foods.  Drink enough fluid to keep your pee (urine) pale yellow. Managing pain and swelling   Take a warm-water bath (sitz bath) for 20 minutes to ease pain. Do this 3-4 times a day. You may do this in a bathtub or using a portable sitz bath that fits over the toilet.  If told, put ice on the painful area. It may be helpful to use ice between your warm baths. ? Put ice in a plastic bag. ? Place a towel between your skin and the bag. ? Leave the ice on for 20 minutes, 2-3 times a day. General instructions  Take over-the-counter and prescription medicines only as told by your doctor. ? Medicated creams and medicines may be used as told.  Exercise often. Ask your doctor how much and what kind of exercise is best for you.  Go to the bathroom when you have the urge to poop. Do not wait.  Avoid pushing too hard when you poop.  Keep your butt dry and clean. Use wet  toilet paper or moist towelettes after pooping.  Do not sit on the toilet for a long time.  Keep all follow-up visits as told by your doctor. This is important. Contact a doctor if you:  Have pain and swelling that do not get better with treatment or medicine.  Have trouble pooping.  Cannot poop.  Have pain or swelling outside the area of the hemorrhoids. Get help right away if you have:  Bleeding that will not stop. Summary  Hemorrhoids are swollen veins in the butt or around the opening of the butt.  They can cause pain, itching, or bleeding.  Eat foods that have a lot of fiber in them. These include whole grains, beans, nuts, fruits, and vegetables.  Take a warm-water bath (sitz bath) for 20 minutes to ease pain. Do this 3-4 times a  day. This information is not intended to replace advice given to you by your health care provider. Make sure you discuss any questions you have with your health care provider. Document Released: 10/13/2007 Document Revised: 01/11/2018 Document Reviewed: 05/25/2017 Elsevier Patient Education  2020 [employer].  Diverticulosis  Diverticulosis is a condition that develops when small pouches (diverticula) form in the wall of the large intestine (colon). The colon is where water is absorbed and stool is formed. The pouches form when the inside layer of the colon pushes through weak spots in the outer layers of the colon. You may have a few pouches or many of them. What are the causes? The cause of this condition is not known. What increases the risk? The following factors may make you more likely to develop this condition:  Being older than age 68. Your risk for this condition increases with age. Diverticulosis is rare among people younger than age 94. By age 20, many people have it.  Eating a low-fiber diet.  Having frequent constipation.  Being overweight.  Not getting enough exercise.  Smoking.  Taking over-the-counter pain medicines,  like aspirin and ibuprofen.  Having a family history of diverticulosis. What are the signs or symptoms? In most people, there are no symptoms of this condition. If you do have symptoms, they may include:  Bloating.  Cramps in the abdomen.  Constipation or diarrhea.  Pain in the lower left side of the abdomen. How is this diagnosed? This condition is most often diagnosed during an exam for other colon problems. Because diverticulosis usually has no symptoms, it often cannot be diagnosed independently. This condition may be diagnosed by:  Using a flexible scope to examine the colon (colonoscopy).  Taking an X-ray of the colon after dye has been put into the colon (barium enema).  Doing a CT scan. How is this treated? You may not need treatment for this condition if you have never developed an infection related to diverticulosis. If you have had an infection before, treatment may include:  Eating a high-fiber diet. This may include eating more fruits, vegetables, and grains.  Taking a fiber supplement.  Taking a live bacteria supplement (probiotic).  Taking medicine to relax your colon.  Taking antibiotic medicines. Follow these instructions at home:  Drink 6-8 glasses of water or more each day to prevent constipation.  Try not to strain when you have a bowel movement.  If you have had an infection before: ? Eat more fiber as directed by your health care provider or your diet and nutrition specialist (dietitian). ? Take a fiber supplement or probiotic, if your health care provider approves.  Take over-the-counter and prescription medicines only as told by your health care provider.  If you were prescribed an antibiotic, take it as told by your health care provider. Do not stop taking the antibiotic even if you start to feel better.  Keep all follow-up visits as told by your health care provider. This is important. Contact a health care provider if:  You have pain in  your abdomen.  You have bloating.  You have cramps.  You have not had a bowel movement in 3 days. Get help right away if:  Your pain gets worse.  Your bloating becomes very bad.  You have a fever or chills, and your symptoms suddenly get worse.  You vomit.  You have bowel movements that are bloody or black.  You have bleeding from your rectum. Summary  Diverticulosis is a condition that  develops when small pouches (diverticula) form in the wall of the large intestine (colon).  You may have a few pouches or many of them.  This condition is most often diagnosed during an exam for other colon problems.  If you have had an infection related to diverticulosis, treatment may include increasing the fiber in your diet, taking supplements, or taking medicines. This information is not intended to replace advice given to you by your health care provider. Make sure you discuss any questions you have with your health care provider. Document Released: 10/01/2003 Document Revised: 12/16/2016 Document Reviewed: 11/23/2015 Elsevier Patient Education  2020 [employer].    Diverticulosis information provided  Hemorrhoid information provided  Benefiber 1 to 2 tablespoons daily  Repeat colonoscopy in 5 years given history of colonic polyps  At patient request, I called Aram Beecham at [phone] and discussed impression and recommendations

## 2018-11-09 NOTE — Op Note (Signed)
[facility] Patient Name: [name] [name] Procedure Date: 11/09/2018 10:09 AM MRN: [id number] Date of Birth: 1965/05/01 Attending MD: Norvel Richards , MD CSN: [id number] Age: 53 Admit Type: Outpatient Procedure:                Colonoscopy Indications:              Surveillance: Personal history of colonic polyps                            with unknown histology (unable to locate pathology                            results from last colonoscopy) Providers:                Norvel Richards, MD, Janeece Riggers, RN, Aram Candela Referring MD:             Chevis Pretty FNP Medicines:                Midazolam 6 mg IV, Meperidine 25 mg IV, Ondansetron                            4 mg IV Complications:            No immediate complications. Estimated Blood Loss:     Estimated blood loss: none. Procedure:                Pre-Anesthesia Assessment:                           - Prior to the procedure, a History and Physical                            was performed, and patient medications and                            allergies were reviewed. The patient's tolerance of                            previous anesthesia was also reviewed. The risks                            and benefits of the procedure and the sedation                            options and risks were discussed with the patient.                            All questions were answered, and informed consent                            was obtained. Prior Anticoagulants: The patient has  taken no previous anticoagulant or antiplatelet                            agents. ASA Grade Assessment: II - A patient with                            mild systemic disease. After reviewing the risks                            and benefits, the patient was deemed in                            satisfactory condition to undergo the procedure.                           After obtaining  informed consent, the colonoscope                            was passed under direct vision. Throughout the                            procedure, the patient's blood pressure, pulse, and                            oxygen saturations were monitored continuously. The                            [id number]-[id number] ([id number] scope was introduced through                            the anus and advanced to the the cecum, identified                            by appendiceal orifice and ileocecal valve. The                            colonoscopy was performed without difficulty. The                            patient tolerated the procedure well. The quality                            of the bowel preparation was adequate. The                            ileocecal valve, appendiceal orifice, and rectum                            were photographed. The entire colon was well                            visualized. Scope In: 10:49:11 AM Scope Out: 11:00:42 AM Scope Withdrawal Time: 0 hours 7 minutes 57 seconds  Total Procedure Duration: 0 hours 11  minutes 31 seconds  Findings:      The perianal and digital rectal examinations were normal.      Multiple diverticula were found in the sigmoid colon and descending       colon.      Non-bleeding internal hemorrhoids were found during retroflexion. The       hemorrhoids were mild, small and Grade I (internal hemorrhoids that do       not prolapse).      The exam was otherwise without abnormality on direct and retroflexion       views. Impression:               - Diverticulosis in the sigmoid colon and in the                            descending colon.                           - Non-bleeding internal hemorrhoids.                           - The examination was otherwise normal on direct                            and retroflexion views.                           - No specimens collected. Moderate Sedation:      Moderate (conscious) sedation was administered by  the endoscopy nurse       and supervised by the endoscopist. The following parameters were       monitored: oxygen saturation, heart rate, blood pressure, respiratory       rate, EKG, adequacy of pulmonary ventilation, and response to care.       Total physician intraservice time was 14 minutes. Recommendation:           - Patient has a contact number available for                            emergencies. The signs and symptoms of potential                            delayed complications were discussed with the                            patient. Return to normal activities tomorrow.                            Written discharge instructions were provided to the                            patient.                           - Resume previous diet.                           - Continue present medications.                           -  Repeat colonoscopy in 5 years for surveillance.                           - Return to GI office PRN. Procedure Code(s):        --- Professional ---                           [phone], Colonoscopy, flexible; diagnostic, including                            collection of specimen(s) by brushing or washing,                            when performed (separate procedure)                           G0500, Moderate sedation services provided by the                            same physician or other qualified health care                            professional performing a gastrointestinal                            endoscopic service that sedation supports,                            requiring the presence of an independent trained                            observer to assist in the monitoring of the                            patient's level of consciousness and physiological                            status; initial 15 minutes of intra-service time;                            patient age 56 years or older (additional time may                            be reported with  [phone], as appropriate) Diagnosis Code(s):        --- Professional ---                           Z86.010, Personal history of colonic polyps                           K64.0, First degree hemorrhoids                           K57.30, Diverticulosis of large intestine without  perforation or abscess without bleeding CPT copyright 2019 American Medical Association. All rights reserved. The codes documented in this report are preliminary and upon coder review may  be revised to meet current compliance requirements. Gerrit Friends. Maley Venezia, MD Gennette Pac, MD 11/09/2018 11:09:38 AM This report has been signed electronically. Number of Addenda: 0

## 2018-11-14 ENCOUNTER — Encounter (HOSPITAL_COMMUNITY): Payer: Self-pay | Admitting: Internal Medicine

## 2018-12-17 ENCOUNTER — Other Ambulatory Visit: Payer: Self-pay | Admitting: Anesthesiology

## 2018-12-17 DIAGNOSIS — M4727 Other spondylosis with radiculopathy, lumbosacral region: Secondary | ICD-10-CM

## 2018-12-20 ENCOUNTER — Other Ambulatory Visit: Payer: Self-pay

## 2018-12-20 DIAGNOSIS — Z20822 Contact with and (suspected) exposure to covid-19: Secondary | ICD-10-CM

## 2018-12-22 LAB — NOVEL CORONAVIRUS, NAA: SARS-CoV-2, NAA: NOT DETECTED

## 2018-12-28 DIAGNOSIS — H9311 Tinnitus, right ear: Secondary | ICD-10-CM | POA: Diagnosis not present

## 2018-12-28 DIAGNOSIS — Z9889 Other specified postprocedural states: Secondary | ICD-10-CM | POA: Diagnosis not present

## 2018-12-28 DIAGNOSIS — H906 Mixed conductive and sensorineural hearing loss, bilateral: Secondary | ICD-10-CM | POA: Diagnosis not present

## 2018-12-28 DIAGNOSIS — H7101 Cholesteatoma of attic, right ear: Secondary | ICD-10-CM | POA: Diagnosis not present

## 2018-12-28 DIAGNOSIS — H7191 Unspecified cholesteatoma, right ear: Secondary | ICD-10-CM | POA: Diagnosis not present

## 2018-12-28 DIAGNOSIS — H9201 Otalgia, right ear: Secondary | ICD-10-CM | POA: Diagnosis not present

## 2019-01-21 ENCOUNTER — Ambulatory Visit: Payer: Self-pay | Admitting: Nurse Practitioner

## 2019-01-23 DIAGNOSIS — Z23 Encounter for immunization: Secondary | ICD-10-CM | POA: Diagnosis not present

## 2019-02-01 ENCOUNTER — Ambulatory Visit (INDEPENDENT_AMBULATORY_CARE_PROVIDER_SITE_OTHER): Payer: BC Managed Care – PPO | Admitting: Nurse Practitioner

## 2019-02-01 ENCOUNTER — Encounter: Payer: Self-pay | Admitting: Nurse Practitioner

## 2019-02-01 ENCOUNTER — Other Ambulatory Visit: Payer: Self-pay

## 2019-02-01 VITALS — BP 133/88 | HR 80 | Temp 98.6°F | Resp 20 | Ht 73.0 in | Wt 262.0 lb

## 2019-02-01 DIAGNOSIS — E785 Hyperlipidemia, unspecified: Secondary | ICD-10-CM

## 2019-02-01 DIAGNOSIS — Z6833 Body mass index (BMI) 33.0-33.9, adult: Secondary | ICD-10-CM | POA: Diagnosis not present

## 2019-02-01 DIAGNOSIS — Z0000 Encounter for general adult medical examination without abnormal findings: Secondary | ICD-10-CM

## 2019-02-01 DIAGNOSIS — K219 Gastro-esophageal reflux disease without esophagitis: Secondary | ICD-10-CM | POA: Diagnosis not present

## 2019-02-01 DIAGNOSIS — F17200 Nicotine dependence, unspecified, uncomplicated: Secondary | ICD-10-CM

## 2019-02-01 MED ORDER — PANTOPRAZOLE SODIUM 40 MG PO TBEC: 40.0000 mg | DELAYED_RELEASE_TABLET | Freq: Every day | ORAL | 1 refills | Status: DC

## 2019-02-01 MED ORDER — ATORVASTATIN CALCIUM 40 MG PO TABS: 40.0000 mg | ORAL_TABLET | Freq: Every day | ORAL | 1 refills | Status: DC

## 2019-02-01 NOTE — Progress Notes (Signed)
Subjective:    Patient ID: [name] [name], male    DOB: January 19, 1965, 54 y.o.   MRN: [id number]   Chief Complaint: Annual Exam    HPI:  1. Annual physical exam  2. Gastroesophageal reflux disease without esophagitis Patient is on protonix daily and works well to keep symptoms under control.  3. Hyperlipidemia with target LDL less than 100 Does not watch diet and does no dedicated exercise. Lab Results  Component Value Date   CHOL 156 07/19/2018   HDL 33 (L) 07/19/2018   LDLCALC 109 (H) 07/19/2018   TRIG 70 07/19/2018   CHOLHDL 4.7 07/19/2018    4. BMI 33.0-33.9,adult No recent weight changes Wt Readings from Last 3 Encounters:  02/01/19 262 lb (118.8 kg)  11/09/18 260 lb (117.9 kg)  07/19/18 262 lb (118.8 kg)   BMI Readings from Last 3 Encounters:  02/01/19 34.57 kg/m  11/09/18 34.30 kg/m  07/19/18 34.57 kg/m     5. snuff Stopped about 8  Months ago.    Outpatient Encounter Medications as of 02/01/2019  Medication Sig  . atorvastatin (LIPITOR) 40 MG tablet Take 1 tablet (40 mg total) by mouth daily. (Patient taking differently: Take 40 mg by mouth at bedtime. )  . cetirizine (ZYRTEC) 10 MG tablet Take 10 mg by mouth at bedtime.  Marland Kitchen HYDROcodone-acetaminophen (NORCO) 10-325 MG tablet Take 1 tablet by mouth every 8 (eight) hours as needed for pain.  Marland Kitchen ibuprofen (ADVIL) 200 MG tablet Take 800-1,200 mg by mouth every 8 (eight) hours as needed (for pain.).  Marland Kitchen pantoprazole (PROTONIX) 40 MG tablet Take 1 tablet (40 mg total) by mouth daily. (Patient taking differently: Take 40 mg by mouth at bedtime. )     Past Surgical History:  Procedure Laterality Date  . CHOLESTEATOMA EXCISION  1994  . COLONOSCOPY N/A 11/09/2018   Procedure: COLONOSCOPY;  Surgeon: Daneil Dolin, MD;  Location: [facility];  Service: Endoscopy;  Laterality: N/A;  12:00  . Left ear surgery    . Right ear surgery    . right knee arthroscopy      Family History  Problem Relation Age  of Onset  . Colon cancer Neg Hx     New complaints: None Having surgery in right ear on January 26-having an inplant put in  Social history: Lives with wife and kids  Controlled substance contract: n/a    Review of Systems  Constitutional: Negative.   HENT: Negative.   Respiratory: Negative.   Cardiovascular: Negative.   Gastrointestinal: Negative.   Genitourinary: Negative.   Musculoskeletal: Negative.   Neurological: Negative.   Psychiatric/Behavioral: Negative.   All other systems reviewed and are negative.      Objective:   Physical Exam Vitals and nursing note reviewed.  Constitutional:      Appearance: Normal appearance. He is well-developed.  HENT:     Head: Normocephalic.     Right Ear: A foreign body (implant) is present.     Left Ear: A foreign body (implant) is present.     Nose: Nose normal.  Eyes:     Pupils: Pupils are equal, round, and reactive to light.  Neck:     Thyroid: No thyroid mass or thyromegaly.     Vascular: No carotid bruit or JVD.     Trachea: Phonation normal.  Cardiovascular:     Rate and Rhythm: Normal rate and regular rhythm.  Pulmonary:     Effort: Pulmonary effort is normal. No respiratory distress.  Breath sounds: Normal breath sounds.  Abdominal:     General: Bowel sounds are normal.     Palpations: Abdomen is soft.     Tenderness: There is no abdominal tenderness.  Musculoskeletal:        General: Normal range of motion.     Cervical back: Normal range of motion and neck supple.  Lymphadenopathy:     Cervical: No cervical adenopathy.  Skin:    General: Skin is warm and dry.  Neurological:     Mental Status: He is alert and oriented to person, place, and time.  Psychiatric:        Behavior: Behavior normal.        Thought Content: Thought content normal.        Judgment: Judgment normal.    BP 133/88   Pulse 80   Temp 98.6 F (37 C) (Temporal)   Resp 20   Ht _0  (1.854 m)   Wt 262 lb (118.8 kg)   SpO2  98%   BMI 34.57 kg/m        Assessment & Plan:  [name] [name] comes in today with chief complaint of Annual Exam   Diagnosis and orders addressed:  1. Annual physical exam Will let [facility] do ekg and chest xray prior to surgery in 2 weeks - CBC with Differential/Platelet - PSA, total and free  2. Gastroesophageal reflux disease without esophagitis Avoid spicy foods Do not eat 2 hours prior to bedtime - pantoprazole (PROTONIX) 40 MG tablet; Take 1 tablet (40 mg total) by mouth at bedtime.  Dispense: 90 tablet; Refill: 1  3. Hyperlipidemia with target LDL less than 100 Low fat diet - atorvastatin (LIPITOR) 40 MG tablet; Take 1 tablet (40 mg total) by mouth at bedtime.  Dispense: 90 tablet; Refill: 1 - CMP14+EGFR - Lipid panel  4. BMI 33.0-33.9,adult Discussed diet and exercise for person with BMI >25 Will recheck weight in 3-6 months  5. snuff Do not start back   Labs pending Health Maintenance reviewed Diet and exercise encouraged  Follow up plan: 6 months   [place], FNP

## 2019-02-01 NOTE — Patient Instructions (Signed)
Otoplasty, Care After This sheet gives you information about how to care for yourself after your procedure. Your doctor may also give you more specific instructions. If you have problems or questions, call your doctor. What can I expect after the procedure? After the procedure, it is common to:  Have ear pain.  Hear popping or crackling sounds.  Have a small amount of pink fluid in your ear. This may last for a few days.  Have difficulty hearing. This may last for a few days. Follow these instructions at home: Medicines   Take over-the-counter and prescription medicines only as told by your doctor.  Use ear drops only as told by your doctor.  Do not stop using the ear drops even if you feel better. Bathing  Keep your ear dry. Do not let water get in your ear or on the bandage that covers your ear.  Do not take baths, swim, or use a hot tub until your doctor says you can.  If you are able to shower, cover your ear as told by your doctor. Incision and ear care  Depending on the procedure you had, you may have stitches (sutures), skin glue, or adhesive strips. Follow your doctor's instructions about: ? How to take care of your cut (incision). ? How to change your bandage (dressing). ? How and when to remove your stitches, skin glue, or strips. Activity  Avoid activity that takes a lot of effort, including heavy lifting.  Return to your normal activities as told by your doctor.  Do not drive for 24 hours if you received a sedative.  Do not drive or use heavy machinery while taking prescription pain medicine. General instructions  Do not move your head quickly or suddenly.  Try not to cough or sneeze. If you do cough or sneeze, open your mouth.  Do not blow your nose unless told by your doctor. If you do blow your nose, do it gently. Blow your nose on one side at a time.  Do not fly in an airplane or put your head completely underwater until your doctor approves.  Keep  all follow-up visits as told by your doctor. This is important. Contact a doctor if you:  Have redness, puffiness (swelling), or pain at the site of your cut.  Have fluid, blood, or yellowish-white fluid (pus) coming from your cut.  Are not able to hear well.  Have a fever.  Feel sick to your stomach (nauseous). Get help right away if you:  Feel more pressure in your ear.  Have a lot of ear pain.  Throw up (vomit). Summary  Follow your doctor's instructions about ear and incision care after surgery.  Take over-the-counter and prescription medicines, including ear drops, only as told by your doctor.  Contact your doctor right away if you feel pressure in your ear, have a lot of ear pain, or you throw up. This information is not intended to replace advice given to you by your health care provider. Make sure you discuss any questions you have with your health care provider. Document Revised: 10/16/2017 Document Reviewed: 01/16/2017 Elsevier Patient Education  2020 [employer].

## 2019-02-02 LAB — CBC WITH DIFFERENTIAL/PLATELET
Basophils Absolute: 0.1 10*3/uL (ref 0.0–0.2)
Basos: 1 %
EOS (ABSOLUTE): 0.2 10*3/uL (ref 0.0–0.4)
Eos: 2 %
Hematocrit: 49.6 % (ref 37.5–51.0)
Hemoglobin: 17.1 g/dL (ref 13.0–17.7)
Immature Grans (Abs): 0.1 10*3/uL (ref 0.0–0.1)
Immature Granulocytes: 1 %
Lymphocytes Absolute: 2.6 10*3/uL (ref 0.7–3.1)
Lymphs: 27 %
MCH: 32.8 pg (ref 26.6–33.0)
MCHC: 34.5 g/dL (ref 31.5–35.7)
MCV: 95 fL (ref 79–97)
Monocytes Absolute: 0.7 10*3/uL (ref 0.1–0.9)
Monocytes: 7 %
Neutrophils Absolute: 6 10*3/uL (ref 1.4–7.0)
Neutrophils: 62 %
Platelets: 256 10*3/uL (ref 150–450)
RBC: 5.22 x10E6/uL (ref 4.14–5.80)
RDW: 12.3 % (ref 11.6–15.4)
WBC: 9.7 10*3/uL (ref 3.4–10.8)

## 2019-02-02 LAB — CMP14+EGFR
ALT: 16 IU/L (ref 0–44)
AST: 14 IU/L (ref 0–40)
Albumin/Globulin Ratio: 1.4 (ref 1.2–2.2)
Albumin: 4 g/dL (ref 3.8–4.9)
Alkaline Phosphatase: 168 IU/L — ABNORMAL HIGH (ref 39–117)
BUN/Creatinine Ratio: 14 (ref 9–20)
BUN: 11 mg/dL (ref 6–24)
Bilirubin Total: 0.3 mg/dL (ref 0.0–1.2)
CO2: 22 mmol/L (ref 20–29)
Calcium: 8.9 mg/dL (ref 8.7–10.2)
Chloride: 104 mmol/L (ref 96–106)
Creatinine, Ser: 0.78 mg/dL (ref 0.76–1.27)
GFR calc Af Amer: 121 mL/min/{1.73_m2} (ref >59)
GFR calc non Af Amer: 105 mL/min/{1.73_m2} (ref >59)
Globulin, Total: 2.8 g/dL (ref 1.5–4.5)
Glucose: 90 mg/dL (ref 65–99)
Potassium: 4.3 mmol/L (ref 3.5–5.2)
Sodium: 139 mmol/L (ref 134–144)
Total Protein: 6.8 g/dL (ref 6.0–8.5)

## 2019-02-02 LAB — LIPID PANEL
Chol/HDL Ratio: 5 ratio (ref 0.0–5.0)
Cholesterol, Total: 164 mg/dL (ref 100–199)
HDL: 33 mg/dL — ABNORMAL LOW (ref >39)
LDL Chol Calc (NIH): 120 mg/dL — ABNORMAL HIGH (ref 0–99)
Triglycerides: 55 mg/dL (ref 0–149)
VLDL Cholesterol Cal: 11 mg/dL (ref 5–40)

## 2019-02-02 LAB — PSA, TOTAL AND FREE
PSA, Free Pct: 27.1 %
PSA, Free: 0.19 ng/mL
Prostate Specific Ag, Serum: 0.7 ng/mL (ref 0.0–4.0)

## 2019-02-06 DIAGNOSIS — Z20822 Contact with and (suspected) exposure to covid-19: Secondary | ICD-10-CM | POA: Diagnosis not present

## 2019-02-06 DIAGNOSIS — Z01812 Encounter for preprocedural laboratory examination: Secondary | ICD-10-CM | POA: Diagnosis not present

## 2019-02-06 DIAGNOSIS — H7101 Cholesteatoma of attic, right ear: Secondary | ICD-10-CM | POA: Diagnosis not present

## 2019-02-12 DIAGNOSIS — H7101 Cholesteatoma of attic, right ear: Secondary | ICD-10-CM | POA: Diagnosis not present

## 2019-02-12 DIAGNOSIS — H906 Mixed conductive and sensorineural hearing loss, bilateral: Secondary | ICD-10-CM | POA: Diagnosis not present

## 2019-02-12 DIAGNOSIS — H9501 Recurrent cholesteatoma of postmastoidectomy cavity, right ear: Secondary | ICD-10-CM | POA: Diagnosis not present

## 2019-02-12 DIAGNOSIS — H7121 Cholesteatoma of mastoid, right ear: Secondary | ICD-10-CM | POA: Diagnosis not present

## 2019-02-12 DIAGNOSIS — Z9889 Other specified postprocedural states: Secondary | ICD-10-CM | POA: Diagnosis not present

## 2019-02-12 DIAGNOSIS — H9589 Other postprocedural complications and disorders of the ear and mastoid process, not elsewhere classified: Secondary | ICD-10-CM | POA: Diagnosis not present

## 2019-02-12 DIAGNOSIS — Z9621 Cochlear implant status: Secondary | ICD-10-CM | POA: Diagnosis not present

## 2019-02-27 DIAGNOSIS — Z23 Encounter for immunization: Secondary | ICD-10-CM | POA: Diagnosis not present

## 2019-04-04 DIAGNOSIS — Z461 Encounter for fitting and adjustment of hearing aid: Secondary | ICD-10-CM | POA: Diagnosis not present

## 2019-04-04 DIAGNOSIS — H906 Mixed conductive and sensorineural hearing loss, bilateral: Secondary | ICD-10-CM | POA: Diagnosis not present

## 2019-04-04 DIAGNOSIS — Z48810 Encounter for surgical aftercare following surgery on the sense organs: Secondary | ICD-10-CM | POA: Diagnosis not present

## 2019-04-04 DIAGNOSIS — H7493 Unspecified disorder of middle ear and mastoid, bilateral: Secondary | ICD-10-CM | POA: Diagnosis not present

## 2019-04-04 DIAGNOSIS — Z9629 Presence of other otological and audiological implants: Secondary | ICD-10-CM | POA: Diagnosis not present

## 2019-05-18 ENCOUNTER — Other Ambulatory Visit (INDEPENDENT_AMBULATORY_CARE_PROVIDER_SITE_OTHER): Payer: BC Managed Care – PPO | Admitting: Family

## 2019-05-18 DIAGNOSIS — J069 Acute upper respiratory infection, unspecified: Secondary | ICD-10-CM

## 2019-05-18 MED ORDER — PREDNISONE 10 MG (21) PO TBPK: ORAL_TABLET | ORAL | 0 refills | Status: DC

## 2019-05-18 NOTE — Progress Notes (Signed)
   Virtual Visit via telephone Note Due to COVID-19 pandemic this visit was conducted virtually. This visit type was conducted due to national recommendations for restrictions regarding the COVID-19 Pandemic (e.g. social distancing, sheltering in place) in an effort to limit this patient's exposure and mitigate transmission in our community. All issues noted in this document were discussed and addressed.  A physical exam was not performed with this format.  I connected with [name] [name] on 05/18/19 at 9:18 AM by telephone and verified that I am speaking with the correct person using two identifiers. [name] [name] is currently located at home and no one is currently with him during visit. The provider, Jannifer Rodney, FNP is located in their office at time of visit.  I discussed the limitations, risks, security and privacy concerns of performing an evaluation and management service by telephone and the availability of in person appointments. I also discussed with the patient that there may be a patient responsible charge related to this service. The patient expressed understanding and agreed to proceed.   History and Present Illness:  URI  This is a new problem. The current episode started in the past 7 days. The problem has been waxing and waning. There has been no fever. Associated symptoms include congestion, coughing, ear pain ("slight"), rhinorrhea, sinus pain and a sore throat. Pertinent negatives include no headaches. He has tried increased fluids (Zyrtec) for the symptoms. The treatment provided mild relief.      Review of Systems  HENT: Positive for congestion, ear pain ("slight"), rhinorrhea, sinus pain and sore throat.   Respiratory: Positive for cough.   Neurological: Negative for headaches.  All other systems reviewed and are negative.    Observations/Objective: No SOB or distress noted, nasal congestion noted   Assessment and Plan: [name] [name] comes in today  with chief complaint of No chief complaint on file.   Diagnosis and orders addressed:  1. Viral URI - Take meds as prescribed - Use a cool mist humidifier  -Use saline nose sprays frequently -Force fluids -For any cough or congestion  Use plain Mucinex- regular strength or max strength is fine -For fever or aces or pains- take tylenol or ibuprofen. -Throat lozenges if help -New toothbrush in 3 days - RTO if symptoms worsen or do not improve - predniSONE (STERAPRED UNI-PAK 21 TAB) 10 MG (21) TBPK tablet; Use as directed  Dispense: 21 tablet; Refill: 0     I discussed the assessment and treatment plan with the patient. The patient was provided an opportunity to ask questions and all were answered. The patient agreed with the plan and demonstrated an understanding of the instructions.   The patient was advised to call back or seek an in-person evaluation if the symptoms worsen or if the condition fails to improve as anticipated.  The above assessment and management plan was discussed with the patient. The patient verbalized understanding of and has agreed to the management plan. Patient is aware to call the clinic if symptoms persist or worsen. Patient is aware when to return to the clinic for a follow-up visit. Patient educated on when it is appropriate to go to the emergency department.   Time call ended: 9:25 AM    I provided 7 minutes of non-face-to-face time during this encounter.    Jannifer Rodney, FNP

## 2019-05-23 ENCOUNTER — Telehealth: Payer: Self-pay | Admitting: Nurse Practitioner

## 2019-05-23 NOTE — Telephone Encounter (Signed)
Patient states he can go 2-3 hours with no cough or drainage then he feels a lot of pressure build up and then he starts drainage and then cough he said the prednisone helps but he thinks he needs something else. He feels it is a bad sinus infection. Christy called the prednisone in on Friday. Please advise no appointments available.

## 2019-05-23 NOTE — Telephone Encounter (Signed)
  Incoming Patient Call  05/23/2019  What symptoms do you have? Coughing, mucus, sinuses are swollen  How long have you been sick? Since last Tuesday  Have you been seen for this problem? Yes was prescribed antibiotic but it did not work wants something stronger that will stop his coughing  If your provider decides to give you a prescription, which pharmacy would you like for it to be sent to? [facility] [place]    Patient informed that this information will be sent to the clinical staff for review and that they should receive a follow up call.

## 2019-05-24 DIAGNOSIS — J029 Acute pharyngitis, unspecified: Secondary | ICD-10-CM | POA: Diagnosis not present

## 2019-05-24 DIAGNOSIS — H9209 Otalgia, unspecified ear: Secondary | ICD-10-CM | POA: Diagnosis not present

## 2019-05-24 DIAGNOSIS — R0981 Nasal congestion: Secondary | ICD-10-CM | POA: Diagnosis not present

## 2019-05-24 DIAGNOSIS — R05 Cough: Secondary | ICD-10-CM | POA: Diagnosis not present

## 2019-05-24 NOTE — Telephone Encounter (Signed)
Can either do an eviit or go to urgent care

## 2019-05-24 NOTE — Telephone Encounter (Signed)
Pt very upset that we did not get back in touch with him until now. Did not understand why we did not tell him soon to do an evisit or go to the urgent care. Apologized to pt. He stated that he would go to the urgent care.

## 2019-08-05 ENCOUNTER — Encounter: Payer: Self-pay | Admitting: Nurse Practitioner

## 2019-08-05 ENCOUNTER — Other Ambulatory Visit: Payer: Self-pay

## 2019-08-05 ENCOUNTER — Ambulatory Visit: Payer: BC Managed Care – PPO | Admitting: Nurse Practitioner

## 2019-08-05 VITALS — BP 127/95 | HR 76 | Temp 97.8°F | Resp 20 | Ht 73.0 in | Wt 259.0 lb

## 2019-08-05 DIAGNOSIS — M549 Dorsalgia, unspecified: Secondary | ICD-10-CM | POA: Diagnosis not present

## 2019-08-05 DIAGNOSIS — E785 Hyperlipidemia, unspecified: Secondary | ICD-10-CM

## 2019-08-05 DIAGNOSIS — Z6833 Body mass index (BMI) 33.0-33.9, adult: Secondary | ICD-10-CM

## 2019-08-05 DIAGNOSIS — K219 Gastro-esophageal reflux disease without esophagitis: Secondary | ICD-10-CM

## 2019-08-05 DIAGNOSIS — G8929 Other chronic pain: Secondary | ICD-10-CM

## 2019-08-05 MED ORDER — ATORVASTATIN CALCIUM 40 MG PO TABS: 40.0000 mg | ORAL_TABLET | Freq: Every day | ORAL | 1 refills | Status: DC

## 2019-08-05 MED ORDER — PANTOPRAZOLE SODIUM 40 MG PO TBEC: 40.0000 mg | DELAYED_RELEASE_TABLET | Freq: Every day | ORAL | 1 refills | Status: DC

## 2019-08-05 NOTE — Progress Notes (Signed)
Subjective:    Patient ID: [name] [name], male    DOB: 09/27/1965, 54 y.o.   MRN: [id number]  Chief Complaint: Medical Management of Chronic Issues    HPI:  1. Gastroesophageal reflux disease without esophagitis Takes medication every day. No complaints concerning reflux today.  2. Hyperlipidemia with target LDL less than 100 Lab Results  Component Value Date   CHOL 164 02/01/2019   HDL 33 (L) 02/01/2019   LDLCALC 120 (H) 02/01/2019   TRIG 55 02/01/2019   CHOLHDL 5.0 02/01/2019   Denies watching what he eats, walks for exercise. Takes medication everyday as prescribed.  3. Chronic back pain, unspecified back location, unspecified back pain laterality States he 'deals with the pain daily'.  4. BMI 33.0-33.9,adult BMI Readings from Last 3 Encounters:  08/05/19 34.17 kg/m  02/01/19 34.57 kg/m  11/09/18 34.30 kg/m   Wt Readings from Last 3 Encounters:  08/05/19 259 lb (117.5 kg)  02/01/19 262 lb (118.8 kg)  11/09/18 260 lb (117.9 kg)   Walks for physical activity. Stays active at work.    Outpatient Encounter Medications as of 08/05/2019  Medication Sig  . atorvastatin (LIPITOR) 40 MG tablet Take 1 tablet (40 mg total) by mouth at bedtime.  . cetirizine (ZYRTEC) 10 MG tablet Take 10 mg by mouth at bedtime.  Marland Kitchen HYDROcodone-acetaminophen (NORCO) 10-325 MG tablet Take 1 tablet by mouth every 8 (eight) hours as needed for pain.  Marland Kitchen ibuprofen (ADVIL) 200 MG tablet Take 800-1,200 mg by mouth every 8 (eight) hours as needed (for pain.).  Marland Kitchen pantoprazole (PROTONIX) 40 MG tablet Take 1 tablet (40 mg total) by mouth at bedtime.  . [DISCONTINUED] predniSONE (STERAPRED UNI-PAK 21 TAB) 10 MG (21) TBPK tablet Use as directed   No facility-administered encounter medications on file as of 08/05/2019.    Past Surgical History:  Procedure Laterality Date  . CHOLESTEATOMA EXCISION  1994  . COLONOSCOPY N/A 11/09/2018   Procedure: COLONOSCOPY;  Surgeon: Daneil Dolin, MD;   Location: [facility];  Service: Endoscopy;  Laterality: N/A;  12:00  . Left ear surgery    . Right ear surgery    . right knee arthroscopy      Family History  Problem Relation Age of Onset  . Colon cancer Neg Hx     New complaints: No new complaints today.   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   Controlled substance contract: n/a- get [place] meds from pain management    Review of Systems  Constitutional: Negative.   HENT: Positive for hearing loss.   Eyes: Negative.   Respiratory: Negative.   Cardiovascular: Negative.   Gastrointestinal: Negative.   Endocrine: Negative.   Genitourinary: Negative.   Musculoskeletal: Negative.   Skin: Negative.   Allergic/Immunologic: Negative.   Neurological: Negative.   Hematological: Negative.   Psychiatric/Behavioral: Negative.   All other systems reviewed and are negative.      Objective:   Physical Exam Vitals and nursing note reviewed.  Constitutional:      Appearance: Normal appearance.  HENT:     Head: Normocephalic and atraumatic.     Right Ear: Ear canal and external ear normal.     Left Ear: Ear canal and external ear normal.     Ears:     Comments: Surgery bilat for tumor removal     Nose: Nose normal.     Mouth/Throat:     Mouth: Mucous  membranes are moist.     Pharynx: Oropharynx is clear.  Eyes:     Extraocular Movements: Extraocular movements intact.     Conjunctiva/sclera: Conjunctivae normal.     Pupils: Pupils are equal, round, and reactive to light.  Cardiovascular:     Rate and Rhythm: Normal rate and regular rhythm.     Pulses: Normal pulses.     Heart sounds: Normal heart sounds.  Pulmonary:     Effort: Pulmonary effort is normal.     Breath sounds: Normal breath sounds.  Abdominal:     General: Bowel sounds are normal.  Musculoskeletal:        General: Normal range of motion.     Cervical back: Normal range of motion and  neck supple.  Skin:    General: Skin is warm and dry.     Capillary Refill: Capillary refill takes less than 2 seconds.  Neurological:     General: No focal deficit present.     Mental Status: He is alert and oriented to person, place, and time. Mental status is at baseline.  Psychiatric:        Mood and Affect: Mood normal.        Behavior: Behavior normal.        Thought Content: Thought content normal.        Judgment: Judgment normal.   BP (!) 127/95   Pulse 76   Temp 97.8 F (36.6 C) (Temporal)   Resp 20   Ht '6\' 1"'  (1.854 m)   Wt 259 lb (117.5 kg)   SpO2 98%   BMI 34.17 kg/m       Assessment & Plan:  [name] [name] comes in today with chief complaint of Medical Management of Chronic Issues   Diagnosis and orders addressed:  1. Gastroesophageal reflux disease without esophagitis Eat smaller meals, do not lie down after eating meals. Avoid trigger foods.   2. Hyperlipidemia with target LDL less than 100 Avoid fried and fatty foods. Exercise helps lower cholesterol levels.   3. Chronic back pain, unspecified back location, unspecified back pain laterality Take pain medication as needed.  Follow up with a MRI of you back so you can continue steroid injections for pain relief, pain management will order that  4. BMI 33.0-33.9,adult Walking is a great cardiovascular exercise. Follow a healthy diet, avoid unhealthy foods.   Meds ordered this encounter  Medications  . pantoprazole (PROTONIX) 40 MG tablet    Sig: Take 1 tablet (40 mg total) by mouth at bedtime.    Dispense:  90 tablet    Refill:  1    Order Specific Question:   Supervising Provider    Answer:   Caryl Pina A [id number]  . atorvastatin (LIPITOR) 40 MG tablet    Sig: Take 1 tablet (40 mg total) by mouth at bedtime.    Dispense:  90 tablet    Refill:  1    Please consider 90 day supplies to promote better adherence    Order Specific Question:   Supervising Provider    Answer:   Caryl Pina A [[id number]]   Orders Placed This Encounter  Procedures  . CBC with Differential/Platelet  . CMP14+EGFR  . Lipid panel    Labs pending Health Maintenance reviewed Diet and exercise encouraged  Follow up plan: Follow up in 6 months.    Mary-Margaret Hassell Done, FNP

## 2019-08-05 NOTE — Patient Instructions (Signed)
DASH Eating Plan DASH stands for "Dietary Approaches to Stop Hypertension." The DASH eating plan is a healthy eating plan that has been shown to reduce high blood pressure (hypertension). It may also reduce your risk for type 2 diabetes, heart disease, and stroke. The DASH eating plan may also help with weight loss. What are tips for following this plan?  General guidelines  Avoid eating more than 2,300 mg (milligrams) of salt (sodium) a day. If you have hypertension, you may need to reduce your sodium intake to 1,500 mg a day.  Limit alcohol intake to no more than 1 drink a day for nonpregnant women and 2 drinks a day for men. One drink equals 12 oz of beer, 5 oz of wine, or 1 oz of hard liquor.  Work with your health care provider to maintain a healthy body weight or to lose weight. Ask what an ideal weight is for you.  Get at least 30 minutes of exercise that causes your heart to beat faster (aerobic exercise) most days of the week. Activities may include walking, swimming, or biking.  Work with your health care provider or diet and nutrition specialist (dietitian) to adjust your eating plan to your individual calorie needs. Reading food labels   Check food labels for the amount of sodium per serving. Choose foods with less than 5 percent of the Daily Value of sodium. Generally, foods with less than 300 mg of sodium per serving fit into this eating plan.  To find whole grains, look for the word "whole" as the first word in the ingredient list. Shopping  Buy products labeled as "low-sodium" or "no salt added."  Buy fresh foods. Avoid canned foods and premade or frozen meals. Cooking  Avoid adding salt when cooking. Use salt-free seasonings or herbs instead of table salt or sea salt. Check with your health care provider or pharmacist before using salt substitutes.  Do not fry foods. Cook foods using healthy methods such as baking, boiling, grilling, and broiling instead.  Cook with  heart-healthy oils, such as olive, canola, soybean, or sunflower oil. Meal planning  Eat a balanced diet that includes: ? 5 or more servings of fruits and vegetables each day. At each meal, try to fill half of your plate with fruits and vegetables. ? Up to 6-8 servings of whole grains each day. ? Less than 6 oz of lean meat, poultry, or fish each day. A 3-oz serving of meat is about the same size as a deck of cards. One egg equals 1 oz. ? 2 servings of low-fat dairy each day. ? A serving of nuts, seeds, or beans 5 times each week. ? Heart-healthy fats. Healthy fats called Omega-3 fatty acids are found in foods such as flaxseeds and coldwater fish, like sardines, salmon, and mackerel.  Limit how much you eat of the following: ? Canned or prepackaged foods. ? Food that is high in trans fat, such as fried foods. ? Food that is high in saturated fat, such as fatty meat. ? Sweets, desserts, sugary drinks, and other foods with added sugar. ? Full-fat dairy products.  Do not salt foods before eating.  Try to eat at least 2 vegetarian meals each week.  Eat more home-cooked food and less restaurant, buffet, and fast food.  When eating at a restaurant, ask that your food be prepared with less salt or no salt, if possible. What foods are recommended? The items listed may not be a complete list. Talk with your dietitian about   what dietary choices are best for you. Grains Whole-grain or whole-wheat bread. Whole-grain or whole-wheat pasta. Brown rice. Oatmeal. Quinoa. Bulgur. Whole-grain and low-sodium cereals. Pita bread. Low-fat, low-sodium crackers. Whole-wheat flour tortillas. Vegetables Fresh or frozen vegetables (raw, steamed, roasted, or grilled). Low-sodium or reduced-sodium tomato and vegetable juice. Low-sodium or reduced-sodium tomato sauce and tomato paste. Low-sodium or reduced-sodium canned vegetables. Fruits All fresh, dried, or frozen fruit. Canned fruit in natural juice (without  added sugar). Meat and other protein foods Skinless chicken or turkey. Ground chicken or turkey. Pork with fat trimmed off. Fish and seafood. Egg whites. Dried beans, peas, or lentils. Unsalted nuts, nut butters, and seeds. Unsalted canned beans. Lean cuts of beef with fat trimmed off. Low-sodium, lean deli meat. Dairy Low-fat (1%) or fat-free (skim) milk. Fat-free, low-fat, or reduced-fat cheeses. Nonfat, low-sodium ricotta or cottage cheese. Low-fat or nonfat yogurt. Low-fat, low-sodium cheese. Fats and oils Soft margarine without trans fats. Vegetable oil. Low-fat, reduced-fat, or light mayonnaise and salad dressings (reduced-sodium). Canola, safflower, olive, soybean, and sunflower oils. Avocado. Seasoning and other foods Herbs. Spices. Seasoning mixes without salt. Unsalted popcorn and pretzels. Fat-free sweets. What foods are not recommended? The items listed may not be a complete list. Talk with your dietitian about what dietary choices are best for you. Grains Baked goods made with fat, such as croissants, muffins, or some breads. Dry pasta or rice meal packs. Vegetables Creamed or fried vegetables. Vegetables in a cheese sauce. Regular canned vegetables (not low-sodium or reduced-sodium). Regular canned tomato sauce and paste (not low-sodium or reduced-sodium). Regular tomato and vegetable juice (not low-sodium or reduced-sodium). Pickles. Olives. Fruits Canned fruit in a light or heavy syrup. Fried fruit. Fruit in cream or butter sauce. Meat and other protein foods Fatty cuts of meat. Ribs. Fried meat. Bacon. Sausage. Bologna and other processed lunch meats. Salami. Fatback. Hotdogs. Bratwurst. Salted nuts and seeds. Canned beans with added salt. Canned or smoked fish. Whole eggs or egg yolks. Chicken or turkey with skin. Dairy Whole or 2% milk, cream, and half-and-half. Whole or full-fat cream cheese. Whole-fat or sweetened yogurt. Full-fat cheese. Nondairy creamers. Whipped toppings.  Processed cheese and cheese spreads. Fats and oils Butter. Stick margarine. Lard. Shortening. Ghee. Bacon fat. Tropical oils, such as coconut, palm kernel, or palm oil. Seasoning and other foods Salted popcorn and pretzels. Onion salt, garlic salt, seasoned salt, table salt, and sea salt. Worcestershire sauce. Tartar sauce. Barbecue sauce. Teriyaki sauce. Soy sauce, including reduced-sodium. Steak sauce. Canned and packaged gravies. Fish sauce. Oyster sauce. Cocktail sauce. Horseradish that you find on the shelf. Ketchup. Mustard. Meat flavorings and tenderizers. Bouillon cubes. Hot sauce and Tabasco sauce. Premade or packaged marinades. Premade or packaged taco seasonings. Relishes. Regular salad dressings. Where to find more information:  National Heart, Lung, and Blood Institute: www.nhlbi.nih.gov  American Heart Association: www.heart.org Summary  The DASH eating plan is a healthy eating plan that has been shown to reduce high blood pressure (hypertension). It may also reduce your risk for type 2 diabetes, heart disease, and stroke.  With the DASH eating plan, you should limit salt (sodium) intake to 2,300 mg a day. If you have hypertension, you may need to reduce your sodium intake to 1,500 mg a day.  When on the DASH eating plan, aim to eat more fresh fruits and vegetables, whole grains, lean proteins, low-fat dairy, and heart-healthy fats.  Work with your health care provider or diet and nutrition specialist (dietitian) to adjust your eating plan to your   individual calorie needs. This information is not intended to replace advice given to you by your health care provider. Make sure you discuss any questions you have with your health care provider. Document Revised: 12/16/2016 Document Reviewed: 12/28/2015 Elsevier Patient Education  2020 Elsevier Inc.  

## 2019-08-15 DIAGNOSIS — H906 Mixed conductive and sensorineural hearing loss, bilateral: Secondary | ICD-10-CM | POA: Diagnosis not present

## 2019-08-15 DIAGNOSIS — Z888 Allergy status to other drugs, medicaments and biological substances status: Secondary | ICD-10-CM | POA: Diagnosis not present

## 2019-08-15 DIAGNOSIS — H9311 Tinnitus, right ear: Secondary | ICD-10-CM | POA: Diagnosis not present

## 2019-08-15 DIAGNOSIS — Z8669 Personal history of other diseases of the nervous system and sense organs: Secondary | ICD-10-CM | POA: Diagnosis not present

## 2019-08-15 DIAGNOSIS — Z9621 Cochlear implant status: Secondary | ICD-10-CM | POA: Diagnosis not present

## 2019-08-15 DIAGNOSIS — R42 Dizziness and giddiness: Secondary | ICD-10-CM | POA: Diagnosis not present

## 2019-08-15 DIAGNOSIS — H538 Other visual disturbances: Secondary | ICD-10-CM | POA: Diagnosis not present

## 2019-08-15 DIAGNOSIS — Z882 Allergy status to sulfonamides status: Secondary | ICD-10-CM | POA: Diagnosis not present

## 2019-08-15 DIAGNOSIS — R519 Headache, unspecified: Secondary | ICD-10-CM | POA: Diagnosis not present

## 2019-08-15 DIAGNOSIS — Z9889 Other specified postprocedural states: Secondary | ICD-10-CM | POA: Diagnosis not present

## 2019-10-24 DIAGNOSIS — R42 Dizziness and giddiness: Secondary | ICD-10-CM | POA: Diagnosis not present

## 2019-10-24 DIAGNOSIS — R519 Headache, unspecified: Secondary | ICD-10-CM | POA: Diagnosis not present

## 2019-10-24 DIAGNOSIS — H538 Other visual disturbances: Secondary | ICD-10-CM | POA: Diagnosis not present

## 2019-10-31 DIAGNOSIS — H7493 Unspecified disorder of middle ear and mastoid, bilateral: Secondary | ICD-10-CM | POA: Diagnosis not present

## 2019-10-31 DIAGNOSIS — Z9621 Cochlear implant status: Secondary | ICD-10-CM | POA: Diagnosis not present

## 2019-10-31 DIAGNOSIS — Z9889 Other specified postprocedural states: Secondary | ICD-10-CM | POA: Diagnosis not present

## 2019-10-31 DIAGNOSIS — H903 Sensorineural hearing loss, bilateral: Secondary | ICD-10-CM | POA: Diagnosis not present

## 2019-10-31 DIAGNOSIS — Z9089 Acquired absence of other organs: Secondary | ICD-10-CM | POA: Diagnosis not present

## 2019-10-31 DIAGNOSIS — H748X3 Other specified disorders of middle ear and mastoid, bilateral: Secondary | ICD-10-CM | POA: Diagnosis not present

## 2020-02-06 ENCOUNTER — Other Ambulatory Visit: Payer: Self-pay

## 2020-02-06 ENCOUNTER — Encounter: Payer: Self-pay | Admitting: Nurse Practitioner

## 2020-02-06 ENCOUNTER — Ambulatory Visit (INDEPENDENT_AMBULATORY_CARE_PROVIDER_SITE_OTHER): Payer: BC Managed Care – PPO | Admitting: Nurse Practitioner

## 2020-02-06 VITALS — BP 127/93 | HR 90 | Temp 98.3°F | Resp 20 | Ht 73.0 in | Wt 256.0 lb

## 2020-02-06 DIAGNOSIS — F17200 Nicotine dependence, unspecified, uncomplicated: Secondary | ICD-10-CM

## 2020-02-06 DIAGNOSIS — E785 Hyperlipidemia, unspecified: Secondary | ICD-10-CM

## 2020-02-06 DIAGNOSIS — K219 Gastro-esophageal reflux disease without esophagitis: Secondary | ICD-10-CM | POA: Diagnosis not present

## 2020-02-06 DIAGNOSIS — Z6833 Body mass index (BMI) 33.0-33.9, adult: Secondary | ICD-10-CM

## 2020-02-06 DIAGNOSIS — Z0001 Encounter for general adult medical examination with abnormal findings: Secondary | ICD-10-CM

## 2020-02-06 DIAGNOSIS — Z125 Encounter for screening for malignant neoplasm of prostate: Secondary | ICD-10-CM

## 2020-02-06 MED ORDER — ATORVASTATIN CALCIUM 40 MG PO TABS: 40.0000 mg | ORAL_TABLET | Freq: Every day | ORAL | 1 refills | Status: DC

## 2020-02-06 MED ORDER — PANTOPRAZOLE SODIUM 40 MG PO TBEC: 40.0000 mg | DELAYED_RELEASE_TABLET | Freq: Every day | ORAL | 1 refills | Status: DC

## 2020-02-06 MED ORDER — ACYCLOVIR 5 % EX OINT: 1.0000 "application " | TOPICAL_OINTMENT | CUTANEOUS | 1 refills | Status: DC

## 2020-02-06 NOTE — Progress Notes (Signed)
Subjective:    Patient ID: [name] [name], male    DOB: 04-05-1965, 55 y.o.   MRN: [id number]   Chief Complaint: Annual Exam    HPI:  1. Hyperlipidemia with target LDL less than 100 Does try to watch diet but does little to no exercise.  Lab Results  Component Value Date   CHOL 164 02/01/2019   HDL 33 (L) 02/01/2019   LDLCALC 120 (H) 02/01/2019   TRIG 55 02/01/2019   CHOLHDL 5.0 02/01/2019   The 10-year ASCVD risk score [name] [name] DC Jr., et al., 2013) is: 4.8%   2. Gastroesophageal reflux disease without esophagitis Takes protonix daily . If he does not take it he gets severe GERD symptoms.  3. snuff He says he has really cut back. Does about 5x a month.  4. BMI 33.0-33.9,adult No recent weight changes Wt Readings from Last 3 Encounters:  02/06/20 256 lb (116.1 kg)  08/05/19 259 lb (117.5 kg)  02/01/19 262 lb (118.8 kg)   BMI Readings from Last 3 Encounters:  02/06/20 33.78 kg/m  08/05/19 34.17 kg/m  02/01/19 34.57 kg/m       Outpatient Encounter Medications as of 02/06/2020  Medication Sig  . atorvastatin (LIPITOR) 40 MG tablet Take 1 tablet (40 mg total) by mouth at bedtime.  . cetirizine (ZYRTEC) 10 MG tablet Take 10 mg by mouth at bedtime.  Marland Kitchen HYDROcodone-acetaminophen (NORCO) 10-325 MG tablet Take 1 tablet by mouth every 8 (eight) hours as needed for pain.  Marland Kitchen ibuprofen (ADVIL) 200 MG tablet Take 800-1,200 mg by mouth every 8 (eight) hours as needed (for pain.).  Marland Kitchen pantoprazole (PROTONIX) 40 MG tablet Take 1 tablet (40 mg total) by mouth at bedtime.     Past Surgical History:  Procedure Laterality Date  . CHOLESTEATOMA EXCISION  1994  . COLONOSCOPY N/A 11/09/2018   Procedure: COLONOSCOPY;  Surgeon: Daneil Dolin, MD;  Location: [facility];  Service: Endoscopy;  Laterality: N/A;  12:00  . Left ear surgery    . Right ear surgery    . right knee arthroscopy      Family History  Problem Relation Age of Onset  . Colon cancer Neg Hx     New  complaints: None today  Social history: Lives with wife and children.  Controlled substance contract: goes to pain management    Review of Systems  Constitutional: Negative for diaphoresis.  Eyes: Negative for pain.  Respiratory: Negative for shortness of breath.   Cardiovascular: Negative for chest pain, palpitations and leg swelling.  Gastrointestinal: Negative for abdominal pain.  Endocrine: Negative for polydipsia.  Skin: Negative for rash.  Neurological: Negative for dizziness, weakness and headaches.  Hematological: Does not bruise/bleed easily.  All other systems reviewed and are negative.      Objective:   Physical Exam Vitals and nursing note reviewed.  Constitutional:      Appearance: Normal appearance. He is well-developed and well-nourished.  HENT:     Head: Normocephalic.     Nose: Nose normal.     Mouth/Throat:     Mouth: Oropharynx is clear and moist.  Eyes:     Extraocular Movements: EOM normal.     Pupils: Pupils are equal, round, and reactive to light.  Neck:     Thyroid: No thyroid mass or thyromegaly.     Vascular: No carotid bruit or JVD.     Trachea: Phonation normal.  Cardiovascular:     Rate and Rhythm: Normal rate and regular rhythm.  Pulmonary:     Effort: Pulmonary effort is normal. No respiratory distress.     Breath sounds: Normal breath sounds.  Abdominal:     General: Bowel sounds are normal. Aorta is normal.     Palpations: Abdomen is soft.     Tenderness: There is no abdominal tenderness.  Musculoskeletal:        General: Normal range of motion.     Cervical back: Normal range of motion and neck supple.  Lymphadenopathy:     Cervical: No cervical adenopathy.  Skin:    General: Skin is warm and dry.  Neurological:     Mental Status: He is alert and oriented to person, place, and time.  Psychiatric:        Mood and Affect: Mood and affect normal.        Behavior: Behavior normal.        Thought Content: Thought content  normal.        Judgment: Judgment normal.     BP (!) 127/93   Pulse 90   Temp 98.3 F (36.8 C) (Temporal)   Resp 20   Ht _0  (1.854 m)   Wt 256 lb (116.1 kg)   SpO2 95%   BMI 33.78 kg/m        Assessment & Plan:  [name] [name] comes in today with chief complaint of Annual Exam   Diagnosis and orders addressed:  1. Hyperlipidemia with target LDL less than 100 Low fat diet - atorvastatin (LIPITOR) 40 MG tablet; Take 1 tablet (40 mg total) by mouth at bedtime.  Dispense: 90 tablet; Refill: 1 - CBC with Differential/Platelet - CMP14+EGFR - Lipid panel  2. Gastroesophageal reflux disease without esophagitis Avoid spicy foods Do not eat 2 hours prior to bedtime - pantoprazole (PROTONIX) 40 MG tablet; Take 1 tablet (40 mg total) by mouth at bedtime.  Dispense: 90 tablet; Refill: 1  3. snuff STOP!!!  4. BMI 33.0-33.9,adult Discussed diet and exercise for person with BMI >25 Will recheck weight in 3-6 months  5. Screening for prostate cancer - PSA, total and free   Labs pending Health Maintenance reviewed Diet and exercise encouraged  Follow up plan: 6 months   [place], FNP

## 2020-02-06 NOTE — Patient Instructions (Signed)

## 2020-02-07 LAB — CBC WITH DIFFERENTIAL/PLATELET
Basophils Absolute: 0.1 10*3/uL (ref 0.0–0.2)
Basos: 1 %
EOS (ABSOLUTE): 0.2 10*3/uL (ref 0.0–0.4)
Eos: 1 %
Hematocrit: 49.3 % (ref 37.5–51.0)
Hemoglobin: 16.7 g/dL (ref 13.0–17.7)
Immature Grans (Abs): 0.2 10*3/uL — ABNORMAL HIGH (ref 0.0–0.1)
Immature Granulocytes: 1 %
Lymphocytes Absolute: 3.6 10*3/uL — ABNORMAL HIGH (ref 0.7–3.1)
Lymphs: 27 %
MCH: 33 pg (ref 26.6–33.0)
MCHC: 33.9 g/dL (ref 31.5–35.7)
MCV: 97 fL (ref 79–97)
Monocytes Absolute: 1.1 10*3/uL — ABNORMAL HIGH (ref 0.1–0.9)
Monocytes: 8 %
Neutrophils Absolute: 8.3 10*3/uL — ABNORMAL HIGH (ref 1.4–7.0)
Neutrophils: 62 %
Platelets: 251 10*3/uL (ref 150–450)
RBC: 5.06 x10E6/uL (ref 4.14–5.80)
RDW: 12.8 % (ref 11.6–15.4)
WBC: 13.4 10*3/uL — ABNORMAL HIGH (ref 3.4–10.8)

## 2020-02-07 LAB — CMP14+EGFR
ALT: 27 IU/L (ref 0–44)
AST: 18 IU/L (ref 0–40)
Albumin/Globulin Ratio: 1.5 (ref 1.2–2.2)
Albumin: 4.3 g/dL (ref 3.8–4.9)
Alkaline Phosphatase: 151 IU/L — ABNORMAL HIGH (ref 44–121)
BUN/Creatinine Ratio: 14 (ref 9–20)
BUN: 11 mg/dL (ref 6–24)
Bilirubin Total: 0.4 mg/dL (ref 0.0–1.2)
CO2: 24 mmol/L (ref 20–29)
Calcium: 9 mg/dL (ref 8.7–10.2)
Chloride: 100 mmol/L (ref 96–106)
Creatinine, Ser: 0.77 mg/dL (ref 0.76–1.27)
GFR calc Af Amer: 121 mL/min/{1.73_m2} (ref >59)
GFR calc non Af Amer: 104 mL/min/{1.73_m2} (ref >59)
Globulin, Total: 2.9 g/dL (ref 1.5–4.5)
Glucose: 102 mg/dL — ABNORMAL HIGH (ref 65–99)
Potassium: 3.8 mmol/L (ref 3.5–5.2)
Sodium: 138 mmol/L (ref 134–144)
Total Protein: 7.2 g/dL (ref 6.0–8.5)

## 2020-02-07 LAB — LIPID PANEL
Chol/HDL Ratio: 5.5 ratio — ABNORMAL HIGH (ref 0.0–5.0)
Cholesterol, Total: 176 mg/dL (ref 100–199)
HDL: 32 mg/dL — ABNORMAL LOW (ref >39)
LDL Chol Calc (NIH): 123 mg/dL — ABNORMAL HIGH (ref 0–99)
Triglycerides: 112 mg/dL (ref 0–149)
VLDL Cholesterol Cal: 21 mg/dL (ref 5–40)

## 2020-02-07 LAB — PSA, TOTAL AND FREE
PSA, Free Pct: 21.3 %
PSA, Free: 0.17 ng/mL
Prostate Specific Ag, Serum: 0.8 ng/mL (ref 0.0–4.0)

## 2020-08-07 ENCOUNTER — Other Ambulatory Visit: Payer: Self-pay

## 2020-08-07 ENCOUNTER — Ambulatory Visit: Payer: BC Managed Care – PPO | Admitting: Nurse Practitioner

## 2020-08-07 ENCOUNTER — Encounter: Payer: Self-pay | Admitting: Nurse Practitioner

## 2020-08-07 VITALS — BP 127/84 | HR 77 | Temp 98.1°F | Resp 20 | Ht 73.0 in | Wt 259.0 lb

## 2020-08-07 DIAGNOSIS — E785 Hyperlipidemia, unspecified: Secondary | ICD-10-CM

## 2020-08-07 DIAGNOSIS — K219 Gastro-esophageal reflux disease without esophagitis: Secondary | ICD-10-CM

## 2020-08-07 DIAGNOSIS — Z6833 Body mass index (BMI) 33.0-33.9, adult: Secondary | ICD-10-CM | POA: Diagnosis not present

## 2020-08-07 MED ORDER — PANTOPRAZOLE SODIUM 40 MG PO TBEC: 40.0000 mg | DELAYED_RELEASE_TABLET | Freq: Every day | ORAL | 1 refills | Status: DC

## 2020-08-07 MED ORDER — ATORVASTATIN CALCIUM 40 MG PO TABS: 40.0000 mg | ORAL_TABLET | Freq: Every day | ORAL | 1 refills | Status: DC

## 2020-08-07 MED ORDER — ACYCLOVIR 5 % EX OINT: 1.0000 "application " | TOPICAL_OINTMENT | CUTANEOUS | 1 refills | Status: DC

## 2020-08-07 NOTE — Patient Instructions (Signed)
https://www.nhlbi.nih.gov/files/docs/public/heart/dash_brief.pdf">  DASH Eating Plan DASH stands for Dietary Approaches to Stop Hypertension. The DASH eating plan is a healthy eating plan that has been shown to: Reduce high blood pressure (hypertension). Reduce your risk for type 2 diabetes, heart disease, and stroke. Help with weight loss. What are tips for following this plan? Reading food labels Check food labels for the amount of salt (sodium) per serving. Choose foods with less than 5 percent of the Daily Value of sodium. Generally, foods with less than 300 milligrams (mg) of sodium per serving fit into this eating plan. To find whole grains, look for the word "whole" as the first word in the ingredient list. Shopping Buy products labeled as "low-sodium" or "no salt added." Buy fresh foods. Avoid canned foods and pre-made or frozen meals. Cooking Avoid adding salt when cooking. Use salt-free seasonings or herbs instead of table salt or sea salt. Check with your health care provider or pharmacist before using salt substitutes. Do not fry foods. Cook foods using healthy methods such as baking, boiling, grilling, roasting, and broiling instead. Cook with heart-healthy oils, such as olive, canola, avocado, soybean, or sunflower oil. Meal planning  Eat a balanced diet that includes: 4 or more servings of fruits and 4 or more servings of vegetables each day. Try to fill one-half of your plate with fruits and vegetables. 6-8 servings of whole grains each day. Less than 6 oz (170 g) of lean meat, poultry, or fish each day. A 3-oz (85-g) serving of meat is about the same size as a deck of cards. One egg equals 1 oz (28 g). 2-3 servings of low-fat dairy each day. One serving is 1 cup (237 mL). 1 serving of nuts, seeds, or beans 5 times each week. 2-3 servings of heart-healthy fats. Healthy fats called omega-3 fatty acids are found in foods such as walnuts, flaxseeds, fortified milks, and eggs.  These fats are also found in cold-water fish, such as sardines, salmon, and mackerel. Limit how much you eat of: Canned or prepackaged foods. Food that is high in trans fat, such as some fried foods. Food that is high in saturated fat, such as fatty meat. Desserts and other sweets, sugary drinks, and other foods with added sugar. Full-fat dairy products. Do not salt foods before eating. Do not eat more than 4 egg yolks a week. Try to eat at least 2 vegetarian meals a week. Eat more home-cooked food and less restaurant, buffet, and fast food.  Lifestyle When eating at a restaurant, ask that your food be prepared with less salt or no salt, if possible. If you drink alcohol: Limit how much you use to: 0-1 drink a day for women who are not pregnant. 0-2 drinks a day for men. Be aware of how much alcohol is in your drink. In the [place] one drink equals one 12 oz bottle of beer (355 mL), one 5 oz glass of wine (148 mL), or one 1 oz glass of hard liquor (44 mL). General information Avoid eating more than 2,300 mg of salt a day. If you have hypertension, you may need to reduce your sodium intake to 1,500 mg a day. Work with your health care provider to maintain a healthy body weight or to lose weight. Ask what an ideal weight is for you. Get at least 30 minutes of exercise that causes your heart to beat faster (aerobic exercise) most days of the week. Activities may include walking, swimming, or biking. Work with your health care provider   or dietitian to adjust your eating plan to your individual calorie needs. What foods should I eat? Fruits All fresh, dried, or frozen fruit. Canned fruit in natural juice (without addedsugar). Vegetables Fresh or frozen vegetables (raw, steamed, roasted, or grilled). Low-sodium or reduced-sodium tomato and vegetable juice. Low-sodium or reduced-sodium tomatosauce and tomato paste. Low-sodium or reduced-sodium canned vegetables. Grains Whole-grain or  whole-wheat bread. Whole-grain or whole-wheat pasta. Brown rice. Oatmeal. Quinoa. Bulgur. Whole-grain and low-sodium cereals. Pita bread.Low-fat, low-sodium crackers. Whole-wheat flour tortillas. Meats and other proteins Skinless chicken or turkey. Ground chicken or turkey. Pork with fat trimmed off. Fish and seafood. Egg whites. Dried beans, peas, or lentils. Unsalted nuts, nut butters, and seeds. Unsalted canned beans. Lean cuts of beef with fat trimmed off. Low-sodium, lean precooked or cured meat, such as sausages or meatloaves. Dairy Low-fat (1%) or fat-free (skim) milk. Reduced-fat, low-fat, or fat-free cheeses. Nonfat, low-sodium ricotta or cottage cheese. Low-fat or nonfatyogurt. Low-fat, low-sodium cheese. Fats and oils Soft margarine without trans fats. Vegetable oil. Reduced-fat, low-fat, or light mayonnaise and salad dressings (reduced-sodium). Canola, safflower, olive, avocado, soybean, andsunflower oils. Avocado. Seasonings and condiments Herbs. Spices. Seasoning mixes without salt. Other foods Unsalted popcorn and pretzels. Fat-free sweets. The items listed above may not be a complete list of foods and beverages you can eat. Contact a dietitian for more information. What foods should I avoid? Fruits Canned fruit in a light or heavy syrup. Fried fruit. Fruit in cream or buttersauce. Vegetables Creamed or fried vegetables. Vegetables in a cheese sauce. Regular canned vegetables (not low-sodium or reduced-sodium). Regular canned tomato sauce and paste (not low-sodium or reduced-sodium). Regular tomato and vegetable juice(not low-sodium or reduced-sodium). Pickles. Olives. Grains Baked goods made with fat, such as croissants, muffins, or some breads. Drypasta or rice meal packs. Meats and other proteins Fatty cuts of meat. Ribs. Fried meat. Bacon. Bologna, salami, and other precooked or cured meats, such as sausages or meat loaves. Fat from the back of a pig (fatback). Bratwurst.  Salted nuts and seeds. Canned beans with added salt. Canned orsmoked fish. Whole eggs or egg yolks. Chicken or turkey with skin. Dairy Whole or 2% milk, cream, and half-and-half. Whole or full-fat cream cheese. Whole-fat or sweetened yogurt. Full-fat cheese. Nondairy creamers. Whippedtoppings. Processed cheese and cheese spreads. Fats and oils Butter. Stick margarine. Lard. Shortening. Ghee. Bacon fat. Tropical oils, suchas coconut, palm kernel, or palm oil. Seasonings and condiments Onion salt, garlic salt, seasoned salt, table salt, and sea salt. Worcestershire sauce. Tartar sauce. Barbecue sauce. Teriyaki sauce. Soy sauce, including reduced-sodium. Steak sauce. Canned and packaged gravies. Fish sauce. Oyster sauce. Cocktail sauce. Store-bought horseradish. Ketchup. Mustard. Meat flavorings and tenderizers. Bouillon cubes. Hot sauces. Pre-made or packaged marinades. Pre-made or packaged taco seasonings. Relishes. Regular saladdressings. Other foods Salted popcorn and pretzels. The items listed above may not be a complete list of foods and beverages you should avoid. Contact a dietitian for more information. Where to find more information National Heart, Lung, and Blood Institute: www.nhlbi.nih.gov American Heart Association: www.heart.org Academy of Nutrition and Dietetics: www.eatright.org National Kidney Foundation: www.kidney.org Summary The DASH eating plan is a healthy eating plan that has been shown to reduce high blood pressure (hypertension). It may also reduce your risk for type 2 diabetes, heart disease, and stroke. When on the DASH eating plan, aim to eat more fresh fruits and vegetables, whole grains, lean proteins, low-fat dairy, and heart-healthy fats. With the DASH eating plan, you should limit salt (sodium) intake to 2,300   mg a day. If you have hypertension, you may need to reduce your sodium intake to 1,500 mg a day. Work with your health care provider or dietitian to adjust  your eating plan to your individual calorie needs. This information is not intended to replace advice given to you by your health care provider. Make sure you discuss any questions you have with your healthcare provider. Document Revised: 12/07/2018 Document Reviewed: 12/07/2018 Elsevier Patient Education  2022 Elsevier Inc.  

## 2020-08-07 NOTE — Progress Notes (Signed)
Subjective:    Patient ID: [name] [name], male    DOB: 07-02-1965, 55 y.o.   MRN: [id number]   Chief Complaint: Medical Management of Chronic Issues    HPI:  1. Gastroesophageal reflux disease without esophagitis Tried to take medication regularly. States symptoms are manageable when taking meds regularly.   2. Hyperlipidemia with target LDL less than 100 Lab Results  Component Value Date   CHOL 176 02/06/2020   HDL 32 (L) 02/06/2020   LDLCALC 123 (H) 02/06/2020   TRIG 112 02/06/2020   CHOLHDL 5.5 (H) 02/06/2020   Takes medication as prescribed. Denies that he watches diet, does not avoid fried or fatty foods but says he only eat approx 1 times a day.   3. BMI 33.0-33.9,adult Wt Readings from Last 3 Encounters:  08/07/20 259 lb (117.5 kg)  02/06/20 256 lb (116.1 kg)  08/05/19 259 lb (117.5 kg)   BMI Readings from Last 3 Encounters:  08/07/20 34.17 kg/m  02/06/20 33.78 kg/m  08/05/19 34.17 kg/m   No formal exercise. 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    Outpatient Encounter Medications as of 08/07/2020  Medication Sig   acyclovir ointment (ZOVIRAX) 5 % Apply 1 application topically every 3 (three) hours.   atorvastatin (LIPITOR) 40 MG tablet Take 1 tablet (40 mg total) by mouth at bedtime.   cetirizine (ZYRTEC) 10 MG tablet Take 10 mg by mouth at bedtime.   ibuprofen (ADVIL) 200 MG tablet Take 800-1,200 mg by mouth every 8 (eight) hours as needed (for pain.).   pantoprazole (PROTONIX) 40 MG tablet Take 1 tablet (40 mg total) by mouth at bedtime.   [DISCONTINUED] HYDROcodone-acetaminophen (NORCO) 10-325 MG tablet Take 1 tablet by mouth every 8 (eight) hours as needed for pain.   No facility-administered encounter medications on file as of 08/07/2020.    Past Surgical History:  Procedure Laterality Date   CHOLESTEATOMA EXCISION  1994   COLONOSCOPY N/A 11/09/2018   Procedure: COLONOSCOPY;  Surgeon: Corbin Ade, MD;   Location: [facility];  Service: Endoscopy;  Laterality: N/A;  12:00   Left ear surgery     Right ear surgery     right knee arthroscopy      Family History  Problem Relation Age of Onset   Colon cancer Neg Hx     New complaints: Right elbow pain with overuse, wears brace with some relief. No med intervention.   Social history: Lives at home with wife, Nurse, learning disability.   Controlled substance contract: n/a     Review of Systems  Constitutional: Negative.   HENT: Negative.    Eyes: Negative.   Respiratory: Negative.    Cardiovascular: Negative.   Gastrointestinal: Negative.   Endocrine: Negative.   Genitourinary: Negative.   Musculoskeletal:  Positive for arthralgias.       Right elbow pain  Skin: Negative.   Allergic/Immunologic: Negative.   Neurological: Negative.   Hematological: Negative.   Psychiatric/Behavioral: Negative.    All other systems reviewed and are negative.     Objective:   Physical Exam Vitals and nursing note reviewed.  Constitutional:      Appearance: Normal appearance.  HENT:     Head: Normocephalic and atraumatic.     Right Ear: Tympanic membrane normal.     Left Ear: Tympanic membrane normal.     Nose: Nose normal.     Mouth/Throat:     Mouth: Mucous membranes are moist.  Pharynx: Oropharynx is clear.  Eyes:     Pupils: Pupils are equal, round, and reactive to light.  Cardiovascular:     Rate and Rhythm: Normal rate and regular rhythm.     Pulses: Normal pulses.     Heart sounds: Normal heart sounds.  Pulmonary:     Effort: Pulmonary effort is normal.     Breath sounds: Normal breath sounds.  Abdominal:     General: Abdomen is flat.     Palpations: Abdomen is soft.  Musculoskeletal:        General: Normal range of motion.     Cervical back: Normal range of motion.  Skin:    General: Skin is warm and dry.     Capillary Refill: Capillary refill takes less than 2 seconds.  Neurological:     General: No focal deficit  present.     Mental Status: He is alert and oriented to person, place, and time. Mental status is at baseline.  Psychiatric:        Mood and Affect: Mood normal.        Behavior: Behavior normal.        Thought Content: Thought content normal.        Judgment: Judgment normal.   BP 127/84   Pulse 77   Temp 98.1 F (36.7 C) (Temporal)   Resp 20   Ht 6\' 1"  (1.854 m)   Wt 259 lb (117.5 kg)   SpO2 97%   BMI 34.17 kg/m       Assessment & Plan:  [name] [name] comes in today with chief complaint of Medical Management of Chronic Issues   Diagnosis and orders addressed:  1. Gastroesophageal reflux disease without esophagitis Take medication as prescribed. Avoid trigger foods such as spicy or fatty foods. Eat smaller meals throughout the day verses large meals in one sitting.   2. Hyperlipidemia with target LDL less than 100 Take medication as prescribed. Avoid fried and fatty foods.   3. BMI 33.0-33.9,adult Exercise regularly. Regular exercise will help you to maintain a healthy body weight, help minimize your reflux symptoms, and lower cholesterol levels.   Meds ordered this encounter  Medications   pantoprazole (PROTONIX) 40 MG tablet    Sig: Take 1 tablet (40 mg total) by mouth at bedtime.    Dispense:  90 tablet    Refill:  1    Order Specific Question:   Supervising Provider    Answer:   06-27-1980 A [[id number]]   atorvastatin (LIPITOR) 40 MG tablet    Sig: Take 1 tablet (40 mg total) by mouth at bedtime.    Dispense:  90 tablet    Refill:  1    Please consider 90 day supplies to promote better adherence    Order Specific Question:   Supervising Provider    Answer:   Arville Care A [[id number]]   acyclovir ointment (ZOVIRAX) 5 %    Sig: Apply 1 application topically every 3 (three) hours.    Dispense:  30 g    Refill:  1    Order Specific Question:   Supervising Provider    Answer:   Arville Care A [facility] Care    Labs pending Health Maintenance  reviewed Diet and exercise encouraged  Follow up plan: Follow up in 6 months.   Mary-Margaret [id number], FNP

## 2020-08-08 LAB — CMP14+EGFR
ALT: 18 IU/L (ref 0–44)
AST: 18 IU/L (ref 0–40)
Albumin/Globulin Ratio: 1.5 (ref 1.2–2.2)
Albumin: 4 g/dL (ref 3.8–4.9)
Alkaline Phosphatase: 148 IU/L — ABNORMAL HIGH (ref 44–121)
BUN/Creatinine Ratio: 15 (ref 9–20)
BUN: 12 mg/dL (ref 6–24)
Bilirubin Total: 0.5 mg/dL (ref 0.0–1.2)
CO2: 22 mmol/L (ref 20–29)
Calcium: 8.8 mg/dL (ref 8.7–10.2)
Chloride: 103 mmol/L (ref 96–106)
Creatinine, Ser: 0.81 mg/dL (ref 0.76–1.27)
Globulin, Total: 2.7 g/dL (ref 1.5–4.5)
Glucose: 113 mg/dL — ABNORMAL HIGH (ref 65–99)
Potassium: 4.4 mmol/L (ref 3.5–5.2)
Sodium: 139 mmol/L (ref 134–144)
Total Protein: 6.7 g/dL (ref 6.0–8.5)
eGFR: 106 mL/min/{1.73_m2} (ref >59)

## 2020-08-08 LAB — CBC WITH DIFFERENTIAL/PLATELET
Basophils Absolute: 0.1 10*3/uL (ref 0.0–0.2)
Basos: 1 %
EOS (ABSOLUTE): 0.3 10*3/uL (ref 0.0–0.4)
Eos: 3 %
Hematocrit: 48.3 % (ref 37.5–51.0)
Hemoglobin: 16.5 g/dL (ref 13.0–17.7)
Immature Grans (Abs): 0.1 10*3/uL (ref 0.0–0.1)
Immature Granulocytes: 1 %
Lymphocytes Absolute: 2.6 10*3/uL (ref 0.7–3.1)
Lymphs: 24 %
MCH: 33.7 pg — ABNORMAL HIGH (ref 26.6–33.0)
MCHC: 34.2 g/dL (ref 31.5–35.7)
MCV: 99 fL — ABNORMAL HIGH (ref 79–97)
Monocytes Absolute: 0.9 10*3/uL (ref 0.1–0.9)
Monocytes: 8 %
Neutrophils Absolute: 6.9 10*3/uL (ref 1.4–7.0)
Neutrophils: 63 %
Platelets: 248 10*3/uL (ref 150–450)
RBC: 4.89 x10E6/uL (ref 4.14–5.80)
RDW: 13.2 % (ref 11.6–15.4)
WBC: 10.9 10*3/uL — ABNORMAL HIGH (ref 3.4–10.8)

## 2020-08-08 LAB — LIPID PANEL
Chol/HDL Ratio: 4.8 ratio (ref 0.0–5.0)
Cholesterol, Total: 153 mg/dL (ref 100–199)
HDL: 32 mg/dL — ABNORMAL LOW (ref >39)
LDL Chol Calc (NIH): 105 mg/dL — ABNORMAL HIGH (ref 0–99)
Triglycerides: 81 mg/dL (ref 0–149)
VLDL Cholesterol Cal: 16 mg/dL (ref 5–40)

## 2021-01-08 DIAGNOSIS — H9311 Tinnitus, right ear: Secondary | ICD-10-CM | POA: Diagnosis not present

## 2021-01-08 DIAGNOSIS — H7493 Unspecified disorder of middle ear and mastoid, bilateral: Secondary | ICD-10-CM | POA: Diagnosis not present

## 2021-01-08 DIAGNOSIS — H906 Mixed conductive and sensorineural hearing loss, bilateral: Secondary | ICD-10-CM | POA: Diagnosis not present

## 2021-01-08 DIAGNOSIS — H9193 Unspecified hearing loss, bilateral: Secondary | ICD-10-CM | POA: Diagnosis not present

## 2021-01-08 DIAGNOSIS — Z9621 Cochlear implant status: Secondary | ICD-10-CM | POA: Diagnosis not present

## 2021-02-09 ENCOUNTER — Ambulatory Visit (INDEPENDENT_AMBULATORY_CARE_PROVIDER_SITE_OTHER): Payer: BC Managed Care – PPO | Admitting: Nurse Practitioner

## 2021-02-09 ENCOUNTER — Encounter: Payer: Self-pay | Admitting: Nurse Practitioner

## 2021-02-09 VITALS — BP 121/83 | HR 77 | Temp 98.0°F | Resp 20 | Ht 73.0 in | Wt 260.0 lb

## 2021-02-09 DIAGNOSIS — F17200 Nicotine dependence, unspecified, uncomplicated: Secondary | ICD-10-CM

## 2021-02-09 DIAGNOSIS — Z23 Encounter for immunization: Secondary | ICD-10-CM | POA: Diagnosis not present

## 2021-02-09 DIAGNOSIS — Z125 Encounter for screening for malignant neoplasm of prostate: Secondary | ICD-10-CM | POA: Diagnosis not present

## 2021-02-09 DIAGNOSIS — Z0001 Encounter for general adult medical examination with abnormal findings: Secondary | ICD-10-CM

## 2021-02-09 DIAGNOSIS — E785 Hyperlipidemia, unspecified: Secondary | ICD-10-CM

## 2021-02-09 DIAGNOSIS — Z6833 Body mass index (BMI) 33.0-33.9, adult: Secondary | ICD-10-CM

## 2021-02-09 DIAGNOSIS — K219 Gastro-esophageal reflux disease without esophagitis: Secondary | ICD-10-CM | POA: Diagnosis not present

## 2021-02-09 DIAGNOSIS — Z0000 Encounter for general adult medical examination without abnormal findings: Secondary | ICD-10-CM

## 2021-02-09 MED ORDER — ATORVASTATIN CALCIUM 40 MG PO TABS: 40.0000 mg | ORAL_TABLET | Freq: Every day | ORAL | 1 refills | Status: DC

## 2021-02-09 MED ORDER — PANTOPRAZOLE SODIUM 40 MG PO TBEC: 40.0000 mg | DELAYED_RELEASE_TABLET | Freq: Every day | ORAL | 1 refills | Status: DC

## 2021-02-09 NOTE — Progress Notes (Signed)
Subjective:    Patient ID: [name] [name], male    DOB: May 25, 1965, 56 y.o.   MRN: [id number]   Chief Complaint: medical management of chronic issues      HPI:  [name] [name] is a 56 y.o. who identifies as a male who was assigned male at birth.   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 Annual physical exam  2. Hyperlipidemia with target LDL less than 100 Does not really watch diet and does very little exercise. Lab Results  Component Value Date   CHOL 153 08/07/2020   HDL 32 (L) 08/07/2020   LDLCALC 105 (H) 08/07/2020   TRIG 81 08/07/2020   CHOLHDL 4.8 08/07/2020  The 10-year ASCVD risk score (Arnett DK, et al., 2019) is: 4.6%    3. Gastroesophageal reflux disease without esophagitis Is on protonix daily and is doing well.  4. snuff Has quit  5. BMI 33.0-33.9,adult No recent weight changes Wt Readings from Last 3 Encounters:  02/09/21 260 lb (117.9 kg)  08/07/20 259 lb (117.5 kg)  02/06/20 256 lb (116.1 kg)   BMI Readings from Last 3 Encounters:  02/09/21 34.30 kg/m  08/07/20 34.17 kg/m  02/06/20 33.78 kg/m      New complaints: None today  Allergies  Allergen Reactions   Sulfa Antibiotics Rash   Sulfonamide Derivatives Rash   Sertraline Hcl Nausea And Vomiting    Gi upset    Outpatient Encounter Medications as of 02/09/2021  Medication Sig   acyclovir ointment (ZOVIRAX) 5 % Apply 1 application topically every 3 (three) hours.   atorvastatin (LIPITOR) 40 MG tablet Take 1 tablet (40 mg total) by mouth at bedtime.   cetirizine (ZYRTEC) 10 MG tablet Take 10 mg by mouth at bedtime.   ibuprofen (ADVIL) 200 MG tablet Take 800-1,200 mg by mouth every 8 (eight) hours as needed (for pain.).   pantoprazole (PROTONIX) 40 MG tablet Take 1 tablet (40 mg total) by mouth at bedtime.   No facility-administered encounter medications on file as  of 02/09/2021.    Past Surgical History:  Procedure Laterality Date   CHOLESTEATOMA EXCISION  1994   COLONOSCOPY N/A 11/09/2018   Procedure: COLONOSCOPY;  Surgeon: Daneil Dolin, MD;  Location: [facility];  Service: Endoscopy;  Laterality: N/A;  12:00   Left ear surgery     Right ear surgery     right knee arthroscopy      Family History  Problem Relation Age of Onset   Colon cancer Neg Hx       Controlled substance contract: n/a     Review of Systems  Constitutional:  Negative for diaphoresis.  Eyes:  Negative for pain.  Respiratory:  Negative for shortness of breath.   Cardiovascular:  Negative for chest pain, palpitations and leg swelling.  Gastrointestinal:  Negative for abdominal pain.  Endocrine: Negative for polydipsia.  Skin:  Negative for rash.  Neurological:  Negative for dizziness, weakness and headaches.  Hematological:  Does not bruise/bleed easily.  All other systems reviewed and are negative.     Objective:   Physical Exam Vitals and nursing note reviewed.  Constitutional:      Appearance: Normal appearance. He is well-developed.  HENT:     Head: Normocephalic.     Nose: Nose normal.     Mouth/Throat:     Mouth: Mucous membranes are moist.  Pharynx: Oropharynx is clear.  Eyes:     Pupils: Pupils are equal, round, and reactive to light.  Neck:     Thyroid: No thyroid mass or thyromegaly.     Vascular: No carotid bruit or JVD.     Trachea: Phonation normal.  Cardiovascular:     Rate and Rhythm: Normal rate and regular rhythm.  Pulmonary:     Effort: Pulmonary effort is normal. No respiratory distress.     Breath sounds: Normal breath sounds.  Abdominal:     General: Bowel sounds are normal.     Palpations: Abdomen is soft.     Tenderness: There is no abdominal tenderness.  Musculoskeletal:        General: Normal range of motion.     Cervical back: Normal range of motion and neck supple.  Lymphadenopathy:     Cervical: No cervical  adenopathy.  Skin:    General: Skin is warm and dry.  Neurological:     Mental Status: He is alert and oriented to person, place, and time.  Psychiatric:        Behavior: Behavior normal.        Thought Content: Thought content normal.        Judgment: Judgment normal.    BP 121/83    Pulse 77    Temp 98 F (36.7 C) (Temporal)    Resp 20    Ht '6\' 1"'  (1.854 m)    Wt 260 lb (117.9 kg)    SpO2 98%    BMI 34.30 kg/m        Assessment & Plan:  [name] [name] comes in today with chief complaint of Annual Exam   Diagnosis and orders addressed:  1. Annual physical exam   2. Hyperlipidemia with target LDL less than 100 Low fat diet - CBC with Differential/Platelet - CMP14+EGFR - Lipid panel - atorvastatin (LIPITOR) 40 MG tablet; Take 1 tablet (40 mg total) by mouth at bedtime.  Dispense: 90 tablet; Refill: 1  3. Gastroesophageal reflux disease without esophagitis Avoid spicy foods Do not eat 2 hours prior to bedtime - pantoprazole (PROTONIX) 40 MG tablet; Take 1 tablet (40 mg total) by mouth at bedtime.  Dispense: 90 tablet; Refill: 1  4. snuff Do not start back  5. BMI 33.0-33.9,adult Discussed diet and exercise for person with BMI >25 Will recheck weight in 3-6 months   6. Prostate cancer screening - PSA, total and free   Labs pending Health Maintenance reviewed Diet and exercise encouraged  Follow up plan: 6 months   [place], FNP

## 2021-02-09 NOTE — Addendum Note (Signed)
Addended by: Cleda Daub on: 02/09/2021 01:44 PM   Modules accepted: Orders

## 2021-02-09 NOTE — Patient Instructions (Signed)
Smokeless Tobacco Information, Adult Tobacco is a leafy plant that contains a chemical (nicotine). Nicotine affects your brain and causes you to become addicted to it. Smokeless tobacco is tobacco that you put in your mouth instead of smoking it. It may also be called chewing tobacco or snuff. Smokeless tobacco is made from the leaves of tobacco plants and comes in many forms, such as: Loose, dry leaves. Plugs or twists. Moist pouches. Dissolving lozenges or strips. Chewing, sucking, or holding the tobacco in your mouth causes your mouth to make more saliva. The saliva mixes with the tobacco, which you swallow or spit out. Using tobacco is harmful to your health. How can smokeless tobacco affect me? All forms of tobacco contain many chemicals that can harm every organ in the body. Using smokeless tobacco increases your risk for: Cancer. Tobacco use is one of the leading causes of cancer. Smokeless tobacco is linked to cancer of the mouth, esophagus, and pancreas. Other long-term health problems, including high blood pressure, heart disease, and stroke. Addiction. Pregnancy complications, if this applies. Pregnant women who use smokeless tobacco are more likely to have a miscarriage or deliver a baby too early. Mouth and dental problems, such as: Bad breath. Teeth that look yellow or brown. Mouth sores. Cracking and bleeding lips. Gum recession, gum disease, or tooth decay. Lesions on the soft tissues of your mouth (leukoplakia). The benefits of not using smokeless tobacco include: A healthy mind and body. Saving money. You avoid the cost of buying tobacco and the cost of treating illnesses that are caused by tobacco. What actions can I take to quit using tobacco? Ask your health care provider for help to quit using smokeless tobacco. This may involve treatment. These tips may also help you quit: Pick a date to quit. Set a date within the next two weeks. This gives you time to  prepare. Write down the reasons why you are quitting. Keep this list in places where you will see it often, such as on your bathroom mirror or in your car or wallet. Identify the people, places, things, and activities that make you want to use smokeless tobacco (triggers). Avoid them. Get rid of any tobacco you have and remove any tobacco smells. To do this: Throw away all containers of tobacco at home, at work, and in your car. Throw away any other items that you use regularly when you chew tobacco. Clean your car and make sure to remove all tobacco-related items. Clean your home, including curtains and carpets. Tell your family and friends that you are quitting. Having support can make quitting easier. Chew sugarless gum or sunflower seeds when you want to use smokeless tobacco. Stay positive. Be prepared for cravings. It is common to slip up when you first quit, so take it one day at a time. Stay busy and take care of your body. Get plenty of exercise. Drink enough water to keep your urine pale yellow. Keep track of how many days have passed since you quit. Remembering how long and hard you have worked to quit can help you avoid using smokeless tobacco again. Where to find support Ask your health care provider if there is a local support group for quitting smokeless tobacco. Where to find more information You can learn more about the risks of using smokeless tobacco and the benefits of quitting from these sources: [place]: www.cancer.[place]: www.cancer.gov Centers for Disease Control and Prevention: http://www.wolf.info/ Contact a health care provider if you have:  Trouble quitting smokeless tobacco use on your own. White or other discolored patches in your mouth. Difficulty swallowing. A change in your voice. Unexplained weight loss. Stomach pain, nausea, or vomiting. Summary Smokeless tobacco contains many different chemicals that are known to cause  cancer. Nicotine is an addictive chemical in smokeless tobacco that affects your brain. The benefits of not using smokeless tobacco include having a healthy mind and body and saving money. Tell your family and friends that you are quitting. Having support can make quitting easier. Ask your health care provider for help quitting smokeless tobacco. This may involve treatment. This information is not intended to replace advice given to you by your health care provider. Make sure you discuss any questions you have with your health care provider. Document Revised: 06/04/2020 Document Reviewed: 03/12/2018 Elsevier Patient Education  2022 Elsevier Inc.  

## 2021-02-10 LAB — PSA, TOTAL AND FREE
PSA, Free Pct: 21.1 %
PSA, Free: 0.19 ng/mL
Prostate Specific Ag, Serum: 0.9 ng/mL (ref 0.0–4.0)

## 2021-02-10 LAB — CBC WITH DIFFERENTIAL/PLATELET
Basophils Absolute: 0.1 10*3/uL (ref 0.0–0.2)
Basos: 1 %
EOS (ABSOLUTE): 0.3 10*3/uL (ref 0.0–0.4)
Eos: 3 %
Hematocrit: 48.6 % (ref 37.5–51.0)
Hemoglobin: 16.7 g/dL (ref 13.0–17.7)
Immature Grans (Abs): 0.1 10*3/uL (ref 0.0–0.1)
Immature Granulocytes: 1 %
Lymphocytes Absolute: 2.7 10*3/uL (ref 0.7–3.1)
Lymphs: 30 %
MCH: 33.1 pg — ABNORMAL HIGH (ref 26.6–33.0)
MCHC: 34.4 g/dL (ref 31.5–35.7)
MCV: 96 fL (ref 79–97)
Monocytes Absolute: 0.7 10*3/uL (ref 0.1–0.9)
Monocytes: 8 %
Neutrophils Absolute: 5 10*3/uL (ref 1.4–7.0)
Neutrophils: 57 %
Platelets: 254 10*3/uL (ref 150–450)
RBC: 5.05 x10E6/uL (ref 4.14–5.80)
RDW: 12.1 % (ref 11.6–15.4)
WBC: 8.8 10*3/uL (ref 3.4–10.8)

## 2021-02-10 LAB — LIPID PANEL
Chol/HDL Ratio: 5.5 ratio — ABNORMAL HIGH (ref 0.0–5.0)
Cholesterol, Total: 165 mg/dL (ref 100–199)
HDL: 30 mg/dL — ABNORMAL LOW (ref >39)
LDL Chol Calc (NIH): 117 mg/dL — ABNORMAL HIGH (ref 0–99)
Triglycerides: 98 mg/dL (ref 0–149)
VLDL Cholesterol Cal: 18 mg/dL (ref 5–40)

## 2021-02-10 LAB — CMP14+EGFR
ALT: 14 IU/L (ref 0–44)
AST: 9 IU/L (ref 0–40)
Albumin/Globulin Ratio: 1.2 (ref 1.2–2.2)
Albumin: 3.7 g/dL — ABNORMAL LOW (ref 3.8–4.9)
Alkaline Phosphatase: 147 IU/L — ABNORMAL HIGH (ref 44–121)
BUN/Creatinine Ratio: 17 (ref 9–20)
BUN: 15 mg/dL (ref 6–24)
Bilirubin Total: 0.6 mg/dL (ref 0.0–1.2)
CO2: 24 mmol/L (ref 20–29)
Calcium: 9.2 mg/dL (ref 8.7–10.2)
Chloride: 103 mmol/L (ref 96–106)
Creatinine, Ser: 0.86 mg/dL (ref 0.76–1.27)
Globulin, Total: 3 g/dL (ref 1.5–4.5)
Glucose: 111 mg/dL — ABNORMAL HIGH (ref 70–99)
Potassium: 4.2 mmol/L (ref 3.5–5.2)
Sodium: 138 mmol/L (ref 134–144)
Total Protein: 6.7 g/dL (ref 6.0–8.5)
eGFR: 104 mL/min/{1.73_m2} (ref >59)

## 2021-06-02 ENCOUNTER — Telehealth: Payer: BC Managed Care – PPO | Admitting: Physician Assistant

## 2021-06-02 DIAGNOSIS — H109 Unspecified conjunctivitis: Secondary | ICD-10-CM | POA: Diagnosis not present

## 2021-06-02 MED ORDER — POLYMYXIN B-TRIMETHOPRIM 10000-0.1 UNIT/ML-% OP SOLN: 1.0000 [drp] | OPHTHALMIC | 0 refills | Status: DC

## 2021-06-02 NOTE — Patient Instructions (Signed)
?Jolly Mango, thank you for joining Mar Daring, PA-C for today's virtual visit.  While this provider is not your primary care provider (PCP), if your PCP is located in our provider database this encounter information will be shared with them immediately following your visit. ? ?Consent: ?(Patient) [name] [name] provided verbal consent for this virtual visit at the beginning of the encounter. ? ?Current Medications: ? ?Current Outpatient Medications:  ?  trimethoprim-polymyxin b (POLYTRIM) ophthalmic solution, Place 1 drop into the right eye every 4 (four) hours. X 5 days, Disp: 10 mL, Rfl: 0 ?  acyclovir ointment (ZOVIRAX) 5 %, Apply 1 application topically every 3 (three) hours., Disp: 30 g, Rfl: 1 ?  Albuterol Sulfate (PROAIR RESPICLICK) 123XX123 (90 Base) MCG/ACT AEPB, Inhale into the lungs., Disp: , Rfl:  ?  atorvastatin (LIPITOR) 40 MG tablet, Take 1 tablet (40 mg total) by mouth at bedtime., Disp: 90 tablet, Rfl: 1 ?  cetirizine (ZYRTEC) 10 MG tablet, Take 10 mg by mouth at bedtime., Disp: , Rfl:  ?  ibuprofen (ADVIL) 200 MG tablet, Take 800-1,200 mg by mouth every 8 (eight) hours as needed (for pain.)., Disp: , Rfl:  ?  pantoprazole (PROTONIX) 40 MG tablet, Take 1 tablet (40 mg total) by mouth at bedtime., Disp: 90 tablet, Rfl: 1  ? ?Medications ordered in this encounter:  ?Meds ordered this encounter  ?Medications  ? trimethoprim-polymyxin b (POLYTRIM) ophthalmic solution  ?  Sig: Place 1 drop into the right eye every 4 (four) hours. X 5 days  ?  Dispense:  10 mL  ?  Refill:  0  ?  Order Specific Question:   Supervising Provider  ?  Answer:   Noemi Chapel [[id number]]  ?  ? ?*If you need refills on other medications prior to your next appointment, please contact your pharmacy* ? ?Follow-Up: ?Call back or seek an in-person evaluation if the symptoms worsen or if the condition fails to improve as anticipated. ? ?Other Instructions ?Bacterial Conjunctivitis, Adult ?Bacterial conjunctivitis is an  infection of the clear membrane that covers the white part of the eye and the inner surface of the eyelid (conjunctiva). When the blood vessels in the conjunctiva become inflamed, the eye becomes red or pink. The eye often feels irritated or itchy. Bacterial conjunctivitis spreads easily from person to person (is contagious). It also spreads easily from one eye to the other eye. ?What are the causes? ?This condition is caused by bacteria. You may get the infection if you come into close contact with: ?A person who is infected with the bacteria. ?Items that are contaminated with the bacteria, such as a face towel, contact lens solution, or eye makeup. ?What increases the risk? ?You are more likely to develop this condition if: ?You are exposed to other people who have the infection. ?You wear contact lenses. ?You have a sinus infection. ?You have had a recent eye injury or surgery. ?You have a weak body defense system (immune system). ?You have a medical condition that causes dry eyes. ?What are the signs or symptoms? ?Symptoms of this condition include: ?Thick, yellowish discharge from the eye. This may turn into a crust on the eyelid overnight and cause your eyelids to stick together. ?Tearing or watery eyes. ?Itchy eyes. ?Burning feeling in your eyes. ?Eye redness. ?Swollen eyelids. ?Blurred vision. ?How is this diagnosed? ?This condition is diagnosed based on your symptoms and medical history. Your health care provider may also take a sample of discharge from your eye  to find the cause of your infection. ?How is this treated? ?This condition may be treated with: ?Antibiotic eye drops or ointment to clear the infection more quickly and prevent the spread of infection to others. ?Antibiotic medicines taken by mouth (orally) to treat infections that do not respond to drops or ointments or that last longer than 10 days. ?Cool, wet cloths (cool compresses) placed on the eyes. ?Artificial tears applied 2-6 times a  day. ?Follow these instructions at home: ?Medicines ?Take or apply your antibiotic medicine as told by your health care provider. Do not stop using the antibiotic, even if your condition improves, unless directed by your health care provider. ?Take or apply over-the-counter and prescription medicines only as told by your health care provider. ?Be very careful to avoid touching the edge of your eyelid with the eye-drop bottle or the ointment tube when you apply medicines to the affected eye. This will keep you from spreading the infection to your other eye or to other people. ?Managing discomfort ?Gently wipe away any drainage from your eye with a warm, wet washcloth or a cotton ball. ?Apply a clean, cool compress to your eye for 10-20 minutes, 3-4 times a day. ?General instructions ?Do not wear contact lenses until the inflammation is gone and your health care provider says it is safe to wear them again. Ask your health care provider how to sterilize or replace your contact lenses before you use them again. Wear glasses until you can resume wearing contact lenses. ?Avoid wearing eye makeup until the inflammation is gone. Throw away any old eye cosmetics that may be contaminated. ?Change or wash your pillowcase every day. ?Do not share towels or washcloths. This may spread the infection. ?Wash your hands often with soap and water for at least 20 seconds and especially before touching your face or eyes. Use paper towels to dry your hands. ?Avoid touching or rubbing your eyes. ?Do not drive or use heavy machinery if your vision is blurred. ?Contact a health care provider if: ?You have a fever. ?Your symptoms do not get better after 10 days. ?Get help right away if: ?You have a fever and your symptoms suddenly get worse. ?You have severe pain when you move your eye. ?You have facial pain, redness, or swelling. ?You have a sudden loss of vision. ?Summary ?Bacterial conjunctivitis is an infection of the clear membrane  that covers the white part of the eye and the inner surface of the eyelid (conjunctiva). ?Bacterial conjunctivitis spreads easily from eye to eye and from person to person (is contagious). ?Wash your hands often with soap and water for at least 20 seconds and especially before touching your face or eyes. Use paper towels to dry your hands. ?Take or apply your antibiotic medicine as told by your health care provider. Do not stop using the antibiotic even if your condition improves. ?Contact a health care provider if you have a fever or if your symptoms do not get better after 10 days. Get help right away if you have a sudden loss of vision. ?This information is not intended to replace advice given to you by your health care provider. Make sure you discuss any questions you have with your health care provider. ?Document Revised: 04/15/2020 Document Reviewed: 04/15/2020 ?Elsevier Patient Education ? [place]. ? ? ? ?If you have been instructed to have an in-person evaluation today at a local Urgent Care facility, please use the link below. It will take you to a list of  all of our available [facility], including address, phone number and hours of operation. Please do not delay care.  ?[facility] ? ?If you or a family member do not have a primary care provider, use the link below to schedule a visit and establish care. When you choose a [facility] primary care physician or advanced practice provider, you gain a long-term partner in health. ?Find a Primary Care Provider ? ?Learn more about 's in-office and virtual care options: ?[facility] ?

## 2021-06-02 NOTE — Progress Notes (Signed)
?Virtual Visit Consent  ? ?[name] [name], you are scheduled for a virtual visit with a [facility] provider today. Just as with appointments in the office, your consent must be obtained to participate. Your consent will be active for this visit and any virtual visit you may have with one of our providers in the next 365 days. If you have a MyChart account, a copy of this consent can be sent to you electronically. ? ?As this is a virtual visit, video technology does not allow for your provider to perform a traditional examination. This may limit your provider's ability to fully assess your condition. If your provider identifies any concerns that need to be evaluated in person or the need to arrange testing (such as labs, EKG, etc.), we will make arrangements to do so. Although advances in technology are sophisticated, we cannot ensure that it will always work on either your end or our end. If the connection with a video visit is poor, the visit may have to be switched to a telephone visit. With either a video or telephone visit, we are not always able to ensure that we have a secure connection. ? ?By engaging in this virtual visit, you consent to the provision of healthcare and authorize for your insurance to be billed (if applicable) for the services provided during this visit. Depending on your insurance coverage, you may receive a charge related to this service. ? ?I need to obtain your verbal consent now. Are you willing to proceed with your visit today? [name] [name] has provided verbal consent on 06/02/2021 for a virtual visit (video or telephone). Mar Daring, PA-C ? ?Date: 06/02/2021 10:24 AM ? ?Virtual Visit via Video Note  ? ?[name] [name], connected with  [name] [name]  ([id number], 1966/01/11) on 06/02/21 at 10:45 AM EDT by a video-enabled telemedicine application and verified that I am speaking with the correct person using two identifiers. ? ?Location: ?Patient: Virtual Visit  Location Patient: Home ?Provider: Virtual Visit Location Provider: Home Office ?  ?I discussed the limitations of evaluation and management by telemedicine and the availability of in person appointments. The patient expressed understanding and agreed to proceed.   ? ?History of Present Illness: ?[name] [name] is a 56 y.o. who identifies as a male who was assigned male at birth, and is being seen today for possible pink eye. ? ?HPI: Conjunctivitis  ?The current episode started yesterday. The onset was sudden. The problem occurs rarely. The problem has been gradually worsening. The problem is mild. Nothing relieves the symptoms. Nothing aggravates the symptoms. Associated symptoms include decreased vision, eye itching, rhinorrhea, eye discharge and eye redness. Pertinent negatives include no fever, no double vision, no photophobia, no congestion, no ear discharge, no ear pain, no headaches, no sore throat, no URI and no eye pain. The eye pain is mild. The right eye is affected. The eye pain is not associated with movement. The eyelid exhibits swelling and redness.   ? ? ?Problems:  ?Patient Active Problem List  ? Diagnosis Date Noted  ? BMI 33.0-33.9,adult 10/28/2014  ? Chronic back pain 10/11/2013  ? GERD (gastroesophageal reflux disease) 03/25/2013  ? Hyperlipidemia with target LDL less than 100 02/09/2010  ? snuff 02/09/2010  ? Allergic rhinitis 02/09/2010  ?  ?Allergies:  ?Allergies  ?Allergen Reactions  ? Sulfa Antibiotics Rash  ? Sulfonamide Derivatives Rash  ? Sertraline Hcl Nausea And Vomiting  ?  Gi upset   ? ?Medications:  ?Current  Outpatient Medications:  ?  trimethoprim-polymyxin b (POLYTRIM) ophthalmic solution, Place 1 drop into the right eye every 4 (four) hours. X 5 days, Disp: 10 mL, Rfl: 0 ?  acyclovir ointment (ZOVIRAX) 5 %, Apply 1 application topically every 3 (three) hours., Disp: 30 g, Rfl: 1 ?  Albuterol Sulfate (PROAIR RESPICLICK) 123XX123 (90 Base) MCG/ACT AEPB, Inhale into the lungs., Disp: ,  Rfl:  ?  atorvastatin (LIPITOR) 40 MG tablet, Take 1 tablet (40 mg total) by mouth at bedtime., Disp: 90 tablet, Rfl: 1 ?  cetirizine (ZYRTEC) 10 MG tablet, Take 10 mg by mouth at bedtime., Disp: , Rfl:  ?  ibuprofen (ADVIL) 200 MG tablet, Take 800-1,200 mg by mouth every 8 (eight) hours as needed (for pain.)., Disp: , Rfl:  ?  pantoprazole (PROTONIX) 40 MG tablet, Take 1 tablet (40 mg total) by mouth at bedtime., Disp: 90 tablet, Rfl: 1 ? ?Observations/Objective: ?Patient is well-developed, well-nourished in no acute distress.  ?Resting comfortably at home.  ?Head is normocephalic, atraumatic.  ?No labored breathing.  ?Speech is clear and coherent with logical content.  ?Patient is alert and oriented at baseline.  ?Right eye is injected, conjunctiva appears mildly swollen, purulent drainage noted, upper eye lid is erythematous and swollen (more so on the medial aspect) ? ?Assessment and Plan: ?1. Bacterial conjunctivitis of right eye ?- trimethoprim-polymyxin b (POLYTRIM) ophthalmic solution; Place 1 drop into the right eye every 4 (four) hours. X 5 days  Dispense: 10 mL; Refill: 0 ? ?- Polytrim prescribed ?- Warm compresses ?- Good hand hygiene ?- Work note provided ?- Seek further evaluation if symptoms worsen or fail to improve ? ?Follow Up Instructions: ?I discussed the assessment and treatment plan with the patient. The patient was provided an opportunity to ask questions and all were answered. The patient agreed with the plan and demonstrated an understanding of the instructions.  A copy of instructions were sent to the patient via MyChart unless otherwise noted below.  ? ? ?The patient was advised to call back or seek an in-person evaluation if the symptoms worsen or if the condition fails to improve as anticipated. ? ?Time:  ?I spent 11 minutes with the patient via telehealth technology discussing the above problems/concerns.   ? ?Mar Daring, PA-C ?

## 2021-08-05 DIAGNOSIS — Z9889 Other specified postprocedural states: Secondary | ICD-10-CM | POA: Diagnosis not present

## 2021-08-05 DIAGNOSIS — H9201 Otalgia, right ear: Secondary | ICD-10-CM | POA: Diagnosis not present

## 2021-08-05 DIAGNOSIS — Z9621 Cochlear implant status: Secondary | ICD-10-CM | POA: Diagnosis not present

## 2021-08-05 DIAGNOSIS — R519 Headache, unspecified: Secondary | ICD-10-CM | POA: Diagnosis not present

## 2021-08-05 DIAGNOSIS — Z9629 Presence of other otological and audiological implants: Secondary | ICD-10-CM | POA: Diagnosis not present

## 2021-08-05 DIAGNOSIS — H7493 Unspecified disorder of middle ear and mastoid, bilateral: Secondary | ICD-10-CM | POA: Diagnosis not present

## 2021-08-05 DIAGNOSIS — H9311 Tinnitus, right ear: Secondary | ICD-10-CM | POA: Diagnosis not present

## 2021-08-05 DIAGNOSIS — H906 Mixed conductive and sensorineural hearing loss, bilateral: Secondary | ICD-10-CM | POA: Diagnosis not present

## 2021-08-05 DIAGNOSIS — H538 Other visual disturbances: Secondary | ICD-10-CM | POA: Diagnosis not present

## 2021-08-10 ENCOUNTER — Ambulatory Visit: Payer: BC Managed Care – PPO | Admitting: Nurse Practitioner

## 2021-08-10 ENCOUNTER — Other Ambulatory Visit: Payer: Self-pay

## 2021-08-10 ENCOUNTER — Encounter: Payer: Self-pay | Admitting: Nurse Practitioner

## 2021-08-10 VITALS — BP 120/87 | HR 77 | Temp 98.0°F | Resp 20 | Ht 73.0 in | Wt 256.0 lb

## 2021-08-10 DIAGNOSIS — K219 Gastro-esophageal reflux disease without esophagitis: Secondary | ICD-10-CM

## 2021-08-10 DIAGNOSIS — G8929 Other chronic pain: Secondary | ICD-10-CM

## 2021-08-10 DIAGNOSIS — E785 Hyperlipidemia, unspecified: Secondary | ICD-10-CM

## 2021-08-10 DIAGNOSIS — Z6833 Body mass index (BMI) 33.0-33.9, adult: Secondary | ICD-10-CM

## 2021-08-10 DIAGNOSIS — F17200 Nicotine dependence, unspecified, uncomplicated: Secondary | ICD-10-CM | POA: Diagnosis not present

## 2021-08-10 DIAGNOSIS — M549 Dorsalgia, unspecified: Secondary | ICD-10-CM | POA: Diagnosis not present

## 2021-08-10 DIAGNOSIS — L989 Disorder of the skin and subcutaneous tissue, unspecified: Secondary | ICD-10-CM

## 2021-08-10 MED ORDER — PANTOPRAZOLE SODIUM 40 MG PO TBEC: 40.0000 mg | DELAYED_RELEASE_TABLET | Freq: Every day | ORAL | 1 refills | Status: DC

## 2021-08-10 MED ORDER — ATORVASTATIN CALCIUM 40 MG PO TABS: 40.0000 mg | ORAL_TABLET | Freq: Every day | ORAL | 1 refills | Status: DC

## 2021-08-10 NOTE — Patient Instructions (Signed)
Smokeless Tobacco Information, Adult  Tobacco is a leafy plant that contains a chemical called nicotine. Tobacco use is harmful to your health. Nicotine affects the chemicals in your brain, and this can cause you to crave nicotine. These cravings can lead to addiction. Smokeless tobacco is tobacco that you put in your mouth instead of smoking it. It may also be called chewing tobacco or snuff. Smokeless tobacco is made from the leaves of tobacco plants and comes in many forms, such as: Loose, dry leaves. Plugs or twists. Moist pouches. Dissolving lozenges or strips. Chewing, sucking, or holding the tobacco in your mouth causes your mouth to make more saliva. The saliva mixes with the tobacco to make tobacco "juice" that is swallowed or spit out. How can smokeless tobacco use affect me? All forms of tobacco contain many chemicals that can harm every organ in the body. Using smokeless tobacco increases your risk for: Cancer. Tobacco use is one of the leading causes of cancer. Smokeless tobacco is linked to cancer of the mouth, esophagus, and pancreas. Other long-term health problems, including high blood pressure, heart disease, and stroke. Addiction. Pregnancy complications. Pregnant women who use smokeless tobacco are more likely to have a miscarriage or deliver a baby too early (premature delivery). Mouth and dental problems, such as: Bad breath. Teeth that look yellow or brown. Mouth sores. Cracking and bleeding lips. Gum recession, gum disease, or tooth decay. Lesions on the soft tissues of your mouth (leukoplakia). The benefits of not using smokeless tobacco include: A healthy mind and body. Saving money. You avoid the cost of buying tobacco and the cost of treating illnesses that are caused by tobacco. What actions can I take to quit using tobacco? Ask your health care provider for help to quit using smokeless tobacco. This may involve treatment. These tips may also help you  quit: Pick a date to quit. Set a date within the next 2 weeks. This gives you time to prepare. Write down the reasons why you are quitting. Keep this list in places where you will see it often, such as on your bathroom mirror or in your car or wallet. Identify the people, places, things, and activities that make you want to use smokeless tobacco (triggers). Avoid them. Tell your family and friends that you are quitting. Look for a support group in your area. Having support can make quitting easier. Get rid of any tobacco you have and remove any tobacco smells. To do this: Throw away all containers of tobacco at home, at work, and in your car. Throw away any other items that you use regularly when you chew tobacco. Clean your car and make sure to remove all tobacco-related items. Clean your home, including curtains and carpets. Keep track of how many days have passed since you quit. It may help to cross days off a calendar. Remembering how long and hard you have worked to quit can help you avoid using smokeless tobacco again. Stay positive. Be prepared for cravings. It is common to slip up when you first quit, so take it one day at a time. Stay busy and take care of your body. Get plenty of exercise. Drink enough water to keep your urine pale yellow. Where to find support Ask your health care provider if there is a local support group for quitting smokeless tobacco and any available online resources, such as: Smoke Free: www.veterans.smokefree.gov Be Tobacco Free: www.betobaccofree.hhs.gov Tobacco Quitline: 1-855-QUIT-VET (1-855-784-8838). Hotlines, such as 1-800-QUIT-NOW (1-800-784-8669). Where to find more information   You can learn more about the risks of using smokeless tobacco and the benefits of quitting from these sources: American Cancer Society: www.cancer.org National Cancer Institute: www.cancer.gov Centers for Disease Control and Prevention: www.cdc.gov Food and Drug Administration:  www.fda.gov/tobacco-products Contact a health care provider if you have: Trouble quitting smokeless tobacco use on your own. White or other discolored patches in your mouth. Difficulty swallowing. A change in your voice. Unexplained weight loss. Stomach pain, nausea, or vomiting. Summary Smokeless tobacco contains many different chemicals that are known to cause cancer. Nicotine is an addictive chemical in smokeless tobacco that affects your brain. The benefits of not using smokeless tobacco include having a healthy mind and body and saving money. Tell your family and friends that you are quitting. Having support can make quitting easier. Ask your health care provider for help quitting smokeless tobacco. This may involve treatment. This information is not intended to replace advice given to you by your health care provider. Make sure you discuss any questions you have with your health care provider. Document Revised: 09/30/2020 Document Reviewed: 09/30/2020 Elsevier Patient Education  2023 [employer].  

## 2021-08-10 NOTE — Progress Notes (Signed)
Subjective:    Patient ID: [name] [name], male    DOB: 04-30-1965, 56 y.o.   MRN: [id number]   Chief Complaint: medical management of chronic issues     HPI:  [name] [name] is a 56 y.o. who identifies as a male who was assigned male at birth.   Social history: Lives with: wife and kids Work history: works fo [employer] in today for follow up of the following chronic medical issues:  1. Hyperlipidemia with target LDL less than 100 Does try to watch diet but does no dedicated exercise. Lab Results  Component Value Date   CHOL 165 02/09/2021   HDL 30 (L) 02/09/2021   LDLCALC 117 (H) 02/09/2021   TRIG 98 02/09/2021   CHOLHDL 5.5 (H) 02/09/2021   The 10-year ASCVD risk score (Arnett DK, et al., 2019) is: 5.1%   2. Gastroesophageal reflux disease without esophagitis He is on protonix daily and is doing well. Dpes well as long as he does not eat spicy foods.  3. Chronic back pain, unspecified back location, unspecified back pain laterality he just deals with it. Hurts everyday bit just goes on. Rates back pain 4/10 currently.  4. snuff dips  5. BMI 33.0-33.9,adult Weight is down 4 lbs  Wt Readings from Last 3 Encounters:  08/10/21 256 lb (116.1 kg)  02/09/21 260 lb (117.9 kg)  08/07/20 259 lb (117.5 kg)   BMI Readings from Last 3 Encounters:  08/10/21 33.78 kg/m  02/09/21 34.30 kg/m  08/07/20 34.17 kg/m    New complaints: none  Allergies  Allergen Reactions   Sulfa Antibiotics Rash   Sulfonamide Derivatives Rash   Sertraline Hcl Nausea And Vomiting    Gi upset    Outpatient Encounter Medications as of 08/10/2021  Medication Sig   acyclovir ointment (ZOVIRAX) 5 % Apply 1 application topically every 3 (three) hours.   Albuterol Sulfate (PROAIR RESPICLICK) 944 (90 Base) MCG/ACT AEPB Inhale into the lungs.   atorvastatin (LIPITOR) 40 MG tablet Take 1 tablet (40 mg total) by mouth at bedtime.   cetirizine (ZYRTEC) 10 MG tablet Take 10 mg  by mouth at bedtime.   ibuprofen (ADVIL) 200 MG tablet Take 800-1,200 mg by mouth every 8 (eight) hours as needed (for pain.).   pantoprazole (PROTONIX) 40 MG tablet Take 1 tablet (40 mg total) by mouth at bedtime.   trimethoprim-polymyxin b (POLYTRIM) ophthalmic solution Place 1 drop into the right eye every 4 (four) hours. X 5 days   No facility-administered encounter medications on file as of 08/10/2021.    Past Surgical History:  Procedure Laterality Date   CHOLESTEATOMA EXCISION  1994   COLONOSCOPY N/A 11/09/2018   Procedure: COLONOSCOPY;  Surgeon: Daneil Dolin, MD;  Location: [facility];  Service: Endoscopy;  Laterality: N/A;  12:00   Left ear surgery     Right ear surgery     right knee arthroscopy      Family History  Problem Relation Age of Onset   Colon cancer Neg Hx       Controlled substance contract: n/a     Review of Systems  Constitutional:  Negative for diaphoresis.  HENT:  Positive for ear pain (right chronic).   Eyes:  Negative for pain.  Respiratory:  Negative for shortness of breath.   Cardiovascular:  Negative for chest pain, palpitations and leg swelling.  Gastrointestinal:  Negative for abdominal pain.  Endocrine: Negative for polydipsia.  Musculoskeletal:  Positive for back  pain (chronic).  Skin:  Negative for rash.  Neurological:  Negative for dizziness, weakness and headaches.  Hematological:  Does not bruise/bleed easily.  All other systems reviewed and are negative.      Objective:   Physical Exam Vitals and nursing note reviewed.  Constitutional:      Appearance: Normal appearance. He is well-developed.  HENT:     Head: Normocephalic.     Comments: right ear drum deformity    Nose: Nose normal.     Mouth/Throat:     Mouth: Mucous membranes are moist.     Pharynx: Oropharynx is clear.  Eyes:     Pupils: Pupils are equal, round, and reactive to light.  Neck:     Thyroid: No thyroid mass or thyromegaly.     Vascular: No  carotid bruit or JVD.     Trachea: Phonation normal.  Cardiovascular:     Rate and Rhythm: Normal rate and regular rhythm.  Pulmonary:     Effort: Pulmonary effort is normal. No respiratory distress.     Breath sounds: Normal breath sounds.  Abdominal:     General: Bowel sounds are normal.     Palpations: Abdomen is soft.     Tenderness: There is no abdominal tenderness.  Musculoskeletal:        General: Normal range of motion.     Cervical back: Normal range of motion and neck supple.  Lymphadenopathy:     Cervical: No cervical adenopathy.  Skin:    General: Skin is warm and dry.     Comments: Brown macular lesion on right temple  Neurological:     Mental Status: He is alert and oriented to person, place, and time.  Psychiatric:        Behavior: Behavior normal.        Thought Content: Thought content normal.        Judgment: Judgment normal.     BP 120/87   Pulse 77   Temp 98 F (36.7 C) (Temporal)   Resp 20   Ht '6\' 1"'  (1.854 m)   Wt 256 lb (116.1 kg)   SpO2 98%   BMI 33.78 kg/m        Assessment & Plan:  FILBERT CRAZE comes in today with chief complaint of Medical Management of Chronic Issues and Wants med for weight loss   Diagnosis and orders addressed:  1. Hyperlipidemia with target LDL less than 100 Low fat diet - atorvastatin (LIPITOR) 40 MG tablet; Take 1 tablet (40 mg total) by mouth at bedtime.  Dispense: 90 tablet; Refill: 1 - CBC with Differential/Platelet - CMP14+EGFR - Lipid panel  2. Gastroesophageal reflux disease without esophagitis Avoid spicy foods Do not eat 2 hours prior to bedtime - pantoprazole (PROTONIX) 40 MG tablet; Take 1 tablet (40 mg total) by mouth at bedtime.  Dispense: 90 tablet; Refill: 1  3. Chronic back pain, unspecified back location, unspecified back pain laterality Tylenol OTC as needed  4. snuff Stop snuff  5. BMI 33.0-33.9,adult Discussed diet and exercise for person with BMI >25 Will recheck weight in  3-6 months   6. Facial lesion - Ambulatory referral to Dermatology   Labs pending Health Maintenance reviewed Diet and exercise encouraged  Follow up plan: 6 months   Mary-Margaret Hassell Done, FNP

## 2021-08-11 LAB — CMP14+EGFR
ALT: 29 IU/L (ref 0–44)
AST: 18 IU/L (ref 0–40)
Albumin/Globulin Ratio: 1.4 (ref 1.2–2.2)
Albumin: 3.8 g/dL (ref 3.8–4.9)
Alkaline Phosphatase: 160 IU/L — ABNORMAL HIGH (ref 44–121)
BUN/Creatinine Ratio: 10 (ref 9–20)
BUN: 9 mg/dL (ref 6–24)
Bilirubin Total: 0.3 mg/dL (ref 0.0–1.2)
CO2: 23 mmol/L (ref 20–29)
Calcium: 8.8 mg/dL (ref 8.7–10.2)
Chloride: 102 mmol/L (ref 96–106)
Creatinine, Ser: 0.92 mg/dL (ref 0.76–1.27)
Globulin, Total: 2.8 g/dL (ref 1.5–4.5)
Glucose: 128 mg/dL — ABNORMAL HIGH (ref 70–99)
Potassium: 4.5 mmol/L (ref 3.5–5.2)
Sodium: 139 mmol/L (ref 134–144)
Total Protein: 6.6 g/dL (ref 6.0–8.5)
eGFR: 99 mL/min/{1.73_m2} (ref >59)

## 2021-08-11 LAB — CBC WITH DIFFERENTIAL/PLATELET
Basophils Absolute: 0.1 10*3/uL (ref 0.0–0.2)
Basos: 1 %
EOS (ABSOLUTE): 0.3 10*3/uL (ref 0.0–0.4)
Eos: 3 %
Hematocrit: 49 % (ref 37.5–51.0)
Hemoglobin: 16.8 g/dL (ref 13.0–17.7)
Immature Grans (Abs): 0.1 10*3/uL (ref 0.0–0.1)
Immature Granulocytes: 1 %
Lymphocytes Absolute: 2.7 10*3/uL (ref 0.7–3.1)
Lymphs: 28 %
MCH: 33.6 pg — ABNORMAL HIGH (ref 26.6–33.0)
MCHC: 34.3 g/dL (ref 31.5–35.7)
MCV: 98 fL — ABNORMAL HIGH (ref 79–97)
Monocytes Absolute: 0.8 10*3/uL (ref 0.1–0.9)
Monocytes: 8 %
Neutrophils Absolute: 5.7 10*3/uL (ref 1.4–7.0)
Neutrophils: 59 %
Platelets: 232 10*3/uL (ref 150–450)
RBC: 5 x10E6/uL (ref 4.14–5.80)
RDW: 12.8 % (ref 11.6–15.4)
WBC: 9.6 10*3/uL (ref 3.4–10.8)

## 2021-08-11 LAB — LIPID PANEL
Chol/HDL Ratio: 5.3 ratio — ABNORMAL HIGH (ref 0.0–5.0)
Cholesterol, Total: 159 mg/dL (ref 100–199)
HDL: 30 mg/dL — ABNORMAL LOW (ref >39)
LDL Chol Calc (NIH): 108 mg/dL — ABNORMAL HIGH (ref 0–99)
Triglycerides: 116 mg/dL (ref 0–149)
VLDL Cholesterol Cal: 21 mg/dL (ref 5–40)

## 2021-12-22 ENCOUNTER — Encounter: Payer: Self-pay | Admitting: Nurse Practitioner

## 2021-12-22 ENCOUNTER — Ambulatory Visit: Payer: 59 | Admitting: Nurse Practitioner

## 2021-12-22 VITALS — BP 113/77 | HR 92 | Temp 98.7°F | Ht 73.0 in | Wt 262.0 lb

## 2021-12-22 DIAGNOSIS — J069 Acute upper respiratory infection, unspecified: Secondary | ICD-10-CM

## 2021-12-22 MED ORDER — PREDNISONE 10 MG (21) PO TBPK: ORAL_TABLET | ORAL | 0 refills | Status: DC

## 2021-12-22 MED ORDER — PSEUDOEPH-BROMPHEN-DM 30-2-10 MG/5ML PO SYRP: 5.0000 mL | ORAL_SOLUTION | Freq: Four times a day (QID) | ORAL | 0 refills | Status: DC | PRN

## 2021-12-22 MED ORDER — AZITHROMYCIN 250 MG PO TABS: ORAL_TABLET | ORAL | 0 refills | Status: AC

## 2021-12-22 MED ORDER — SALINE SPRAY 0.65 % NA SOLN: 1.0000 | NASAL | 2 refills | Status: DC | PRN

## 2021-12-22 NOTE — Progress Notes (Unsigned)
Acute Office Visit  Subjective:     Patient ID: [name] [name], male    DOB: [birth year], 56 y.o.   MRN: [id number]  Chief Complaint  Patient presents with   Sinus Problem    Sinusitis This is a new problem. The current episode started in the past 7 days. The problem has been gradually worsening since onset. There has been no fever. Associated symptoms include congestion, headaches and sinus pressure. Pertinent negatives include no coughing. Past treatments include acetaminophen. The treatment provided no relief.    Review of Systems  Constitutional: Negative.  Negative for fever.  HENT:  Positive for congestion, sinus pressure and sinus pain.   Respiratory:  Negative for cough.   Cardiovascular: Negative.   Genitourinary: Negative.   Skin: Negative.  Negative for itching and rash.  Neurological:  Positive for headaches.  All other systems reviewed and are negative.       Objective:    BP 113/77   Pulse 92   Temp 98.7 F (37.1 C)   Ht 6\' 1"  (1.854 m)   Wt 262 lb (118.8 kg)   SpO2 (!) 77%   BMI 34.57 kg/m  BP Readings from Last 3 Encounters:  12/22/21 113/77  08/10/21 120/87  02/09/21 121/83   Wt Readings from Last 3 Encounters:  12/22/21 262 lb (118.8 kg)  08/10/21 256 lb (116.1 kg)  02/09/21 260 lb (117.9 kg)      Physical Exam Vitals and nursing note reviewed. Exam conducted with a chaperone present.  Constitutional:      Appearance: Normal appearance.  HENT:     Head: Normocephalic.     Right Ear: External ear normal.     Left Ear: External ear normal.     Nose: Congestion present.  Eyes:     Conjunctiva/sclera: Conjunctivae normal.  Cardiovascular:     Rate and Rhythm: Normal rate and regular rhythm.     Pulses: Normal pulses.     Heart sounds: Normal heart sounds.  Pulmonary:     Effort: Pulmonary effort is normal.     Breath sounds: Normal breath sounds.  Abdominal:     General: Bowel sounds are normal.  Skin:    General: Skin is  warm.     Findings: No erythema or rash.  Neurological:     General: No focal deficit present.     Mental Status: He is alert and oriented to person, place, and time.  Psychiatric:        Mood and Affect: Mood normal.        Behavior: Behavior normal.     No results found for any visits on 12/22/21.      Assessment & Plan:  Patient presents with symptoms of worsening congestion and sinusitis.  Previously treated in our urgent care with mild to moderate therapeutic effect.  Patient has a history of recurrent frequent sinusitis and upper respiratory infection.  At home COVID-19 test negative.  Advised patient to Take meds as prescribed - Use a cool mist humidifier  -Use saline nose sprays frequently -Force fluids -For fever or aches or pains- take Tylenol or ibuprofen. -If symptoms do not improve, she may need to be COVID tested to rule this out Follow up with worsening unresolved symptoms  Problem List Items Addressed This Visit   None Visit Diagnoses     Upper respiratory tract infection, unspecified type    -  Primary   Relevant Medications   predniSONE (STERAPRED UNI-PAK 21 TAB) 10  MG (21) TBPK tablet   azithromycin (ZITHROMAX) 250 MG tablet   sodium chloride (OCEAN) 0.65 % SOLN nasal spray   brompheniramine-pseudoephedrine-DM 30-2-10 MG/5ML syrup       Meds ordered this encounter  Medications   predniSONE (STERAPRED UNI-PAK 21 TAB) 10 MG (21) TBPK tablet    Sig: 6 tablet day 1, 5 tablet day 2, 4 tablet day 3, 2 tablet day 5, 1 tablet day 6    Dispense:  1 each    Refill:  0    Order Specific Question:   Supervising Provider    Answer:   Standley Brooking   azithromycin (ZITHROMAX) 250 MG tablet    Sig: Take 2 tablets on day 1, then 1 tablet daily on days 2 through 5    Dispense:  6 tablet    Refill:  0    Order Specific Question:   Supervising Provider    Answer:   Mechele Claude [[id number]]   sodium chloride (OCEAN) 0.65 % SOLN nasal spray    Sig: Place 1  spray into both nostrils as needed for congestion.    Dispense:  30 mL    Refill:  2    Order Specific Question:   Supervising Provider    Answer:   Mechele Claude [phone]   brompheniramine-pseudoephedrine-DM 30-2-10 MG/5ML syrup    Sig: Take 5 mLs by mouth 4 (four) times daily as needed.    Dispense:  120 mL    Refill:  0    Order Specific Question:   Supervising Provider    Answer:   Mechele Claude [[id number]]    Return if symptoms worsen or fail to improve.  Daryll Drown, NP

## 2021-12-22 NOTE — Patient Instructions (Signed)

## 2021-12-28 ENCOUNTER — Other Ambulatory Visit: Payer: Self-pay | Admitting: Nurse Practitioner

## 2021-12-28 DIAGNOSIS — J069 Acute upper respiratory infection, unspecified: Secondary | ICD-10-CM

## 2021-12-30 ENCOUNTER — Encounter: Payer: Self-pay | Admitting: Nurse Practitioner

## 2021-12-30 ENCOUNTER — Ambulatory Visit: Payer: 59 | Admitting: Nurse Practitioner

## 2021-12-30 VITALS — BP 135/80 | HR 95 | Temp 98.0°F | Resp 20 | Ht 73.0 in | Wt 259.0 lb

## 2021-12-30 DIAGNOSIS — J40 Bronchitis, not specified as acute or chronic: Secondary | ICD-10-CM

## 2021-12-30 MED ORDER — AMOXICILLIN-POT CLAVULANATE 875-125 MG PO TABS: 1.0000 | ORAL_TABLET | Freq: Two times a day (BID) | ORAL | 0 refills | Status: DC

## 2021-12-30 MED ORDER — ALBUTEROL SULFATE HFA 108 (90 BASE) MCG/ACT IN AERS: 2.0000 | INHALATION_SPRAY | Freq: Four times a day (QID) | RESPIRATORY_TRACT | 0 refills | Status: DC | PRN

## 2021-12-30 MED ORDER — HYDROCODONE BIT-HOMATROP MBR 5-1.5 MG/5ML PO SOLN: 5.0000 mL | Freq: Three times a day (TID) | ORAL | 0 refills | Status: DC | PRN

## 2021-12-30 NOTE — Patient Instructions (Signed)

## 2021-12-30 NOTE — Progress Notes (Signed)
Subjective:    Patient ID: [name] [name], male    DOB: Mar 03, 1965, 56 y.o.   MRN: [id number]   Chief Complaint: URI   Patient come sin  today c/o stll being sick. He was dx with sinuitis. He was given steroids and zithromacx. After he stopped taking meds the symptoms returned.  URI  This is a new problem. The current episode started in the past 7 days. The problem has been waxing and waning. There has been no fever. Associated symptoms include congestion, coughing, rhinorrhea and sinus pain. Pertinent negatives include no headaches or sore throat. He has tried acetaminophen (steroids and antibiotic) for the symptoms. The treatment provided mild relief.       Review of Systems  Constitutional:  Negative for fever.  HENT:  Positive for congestion, rhinorrhea and sinus pain. Negative for sore throat.   Respiratory:  Positive for cough. Negative for shortness of breath.   Neurological:  Negative for dizziness and headaches.       Objective:   Physical Exam Vitals and nursing note reviewed.  Constitutional:      Appearance: Normal appearance. He is well-developed.  HENT:     Head: Normocephalic.     Nose: Congestion and rhinorrhea present.     Mouth/Throat:     Mouth: Mucous membranes are moist.     Pharynx: Oropharynx is clear.  Eyes:     Pupils: Pupils are equal, round, and reactive to light.  Neck:     Thyroid: No thyroid mass or thyromegaly.     Vascular: No carotid bruit or JVD.     Trachea: Phonation normal.  Cardiovascular:     Rate and Rhythm: Normal rate and regular rhythm.  Pulmonary:     Effort: Pulmonary effort is normal. No respiratory distress.     Breath sounds: Normal breath sounds.     Comments: Deep dry cough Abdominal:     General: Bowel sounds are normal.     Palpations: Abdomen is soft.     Tenderness: There is no abdominal tenderness.  Musculoskeletal:        General: Normal range of motion.     Cervical back: Normal range of motion and  neck supple.  Lymphadenopathy:     Cervical: No cervical adenopathy.  Skin:    General: Skin is warm and dry.  Neurological:     Mental Status: He is alert and oriented to person, place, and time.  Psychiatric:        Behavior: Behavior normal.        Thought Content: Thought content normal.        Judgment: Judgment normal.     BP 135/80   Pulse 95   Temp 98 F (36.7 C) (Temporal)   Resp 20   Ht 6\' 1"  (1.854 m)   Wt 259 lb (117.5 kg)   SpO2 94%   BMI 34.17 kg/m        Assessment & Plan:   in today with chief complaint of URI   1. Bronchitis 1. Take meds as prescribed 2. Use a cool mist humidifier especially during the winter months and when heat has been humid. 3. Use saline nose sprays frequently 4. Saline irrigations of the nose can be very helpful if done frequently.  * 4X daily for 1 week*  * Use of a nettie pot can be helpful with this. Follow directions with this* 5. Drink plenty of fluids 6. Keep thermostat turn down low  7.For any cough or congestion- hycodan with sedation precautions 8. For fever or aces or pains- take tylenol or ibuprofen appropriate for age and weight.  * for fevers greater than 101 orally you may alternate ibuprofen and tylenol every  3 hours.    - albuterol (VENTOLIN HFA) 108 (90 Base) MCG/ACT inhaler; Inhale 2 puffs into the lungs every 6 (six) hours as needed for wheezing or shortness of breath.  Dispense: 8 g; Refill: 0 - amoxicillin-clavulanate (AUGMENTIN) 875-125 MG tablet; Take 1 tablet by mouth 2 (two) times daily.  Dispense: 14 tablet; Refill: 0 - HYDROcodone bit-homatropine (HYCODAN) 5-1.5 MG/5ML syrup; Take 5 mLs by mouth every 8 (eight) hours as needed for cough.  Dispense: 120 mL; Refill: 0    The above assessment and management plan was discussed with the patient. The patient verbalized understanding of and has agreed to the management plan. Patient is aware to call the clinic if symptoms persist or  worsen. Patient is aware when to return to the clinic for a follow-up visit. Patient educated on when it is appropriate to go to the emergency department.   [name], FNP

## 2022-01-13 ENCOUNTER — Other Ambulatory Visit: Payer: Self-pay | Admitting: Nurse Practitioner

## 2022-01-13 DIAGNOSIS — J40 Bronchitis, not specified as acute or chronic: Secondary | ICD-10-CM

## 2022-01-14 ENCOUNTER — Telehealth: Payer: BC Managed Care – PPO | Admitting: Physician Assistant

## 2022-01-14 DIAGNOSIS — R052 Subacute cough: Secondary | ICD-10-CM | POA: Diagnosis not present

## 2022-01-14 MED ORDER — PREDNISONE 10 MG (21) PO TBPK: ORAL_TABLET | ORAL | 0 refills | Status: DC

## 2022-01-14 MED ORDER — BENZONATATE 100 MG PO CAPS: 100.0000 mg | ORAL_CAPSULE | Freq: Three times a day (TID) | ORAL | 0 refills | Status: DC | PRN

## 2022-01-14 NOTE — Progress Notes (Signed)
We are sorry that you are not feeling well.  Here is how we plan to help!  Based on your presentation I believe you most likely have A cough due to a virus.  This is called viral bronchitis and is best treated by rest, plenty of fluids and control of the cough.  You may use Ibuprofen or Tylenol as directed to help your symptoms.     In addition you may use A non-prescription cough medication called Mucinex DM: take 2 tablets every 12 hours. and A prescription cough medication called Tessalon Perles 100mg. You may take 1-2 capsules every 8 hours as needed for your cough.  Prednisone 10 mg daily for 6 days (see taper instructions below)  Directions for 6 day taper: Day 1: 2 tablets before breakfast, 1 after both lunch & dinner and 2 at bedtime Day 2: 1 tab before breakfast, 1 after both lunch & dinner and 2 at bedtime Day 3: 1 tab at each meal & 1 at bedtime Day 4: 1 tab at breakfast, 1 at lunch, 1 at bedtime Day 5: 1 tab at breakfast & 1 tab at bedtime Day 6: 1 tab at breakfast  From your responses in the eVisit questionnaire you describe inflammation in the upper respiratory tract which is causing a significant cough.  This is commonly called Bronchitis and has four common causes:   Allergies Viral Infections Acid Reflux Bacterial Infection Allergies, viruses and acid reflux are treated by controlling symptoms or eliminating the cause. An example might be a cough caused by taking certain blood pressure medications. You stop the cough by changing the medication. Another example might be a cough caused by acid reflux. Controlling the reflux helps control the cough.  USE OF BRONCHODILATOR ("RESCUE") INHALERS: There is a risk from using your bronchodilator too frequently.  The risk is that over-reliance on a medication which only relaxes the muscles surrounding the breathing tubes can reduce the effectiveness of medications prescribed to reduce swelling and congestion of the tubes themselves.   Although you feel brief relief from the bronchodilator inhaler, your asthma may actually be worsening with the tubes becoming more swollen and filled with mucus.  This can delay other crucial treatments, such as oral steroid medications. If you need to use a bronchodilator inhaler daily, several times per day, you should discuss this with your provider.  There are probably better treatments that could be used to keep your asthma under control.     HOME CARE Only take medications as instructed by your medical team. Complete the entire course of an antibiotic. Drink plenty of fluids and get plenty of rest. Avoid close contacts especially the very young and the elderly Cover your mouth if you cough or cough into your sleeve. Always remember to wash your hands A steam or ultrasonic humidifier can help congestion.   GET HELP RIGHT AWAY IF: You develop worsening fever. You become short of breath You cough up blood. Your symptoms persist after you have completed your treatment plan MAKE SURE YOU  Understand these instructions. Will watch your condition. Will get help right away if you are not doing well or get worse.    Thank you for choosing an e-visit.  Your e-visit answers were reviewed by a board certified advanced clinical practitioner to complete your personal care plan. Depending upon the condition, your plan could have included both over the counter or prescription medications.  Please review your pharmacy choice. Make sure the pharmacy is open so you can   pick up prescription now. If there is a problem, you may contact your provider through MyChart messaging and have the prescription routed to another pharmacy.  Your safety is important to us. If you have drug allergies check your prescription carefully.   For the next 24 hours you can use MyChart to ask questions about today's visit, request a non-urgent call back, or ask for a work or school excuse. You will get an email in the next  two days asking about your experience. I hope that your e-visit has been valuable and will speed your recovery.  I have spent 5 minutes in review of e-visit questionnaire, review and updating patient chart, medical decision making and response to patient.   Siedah Sedor M Marko Skalski, PA-C  

## 2022-01-23 ENCOUNTER — Emergency Department (HOSPITAL_COMMUNITY): Payer: BC Managed Care – PPO

## 2022-01-23 ENCOUNTER — Encounter (HOSPITAL_COMMUNITY): Payer: Self-pay | Admitting: Emergency Medicine

## 2022-01-23 ENCOUNTER — Emergency Department (HOSPITAL_COMMUNITY)
Admission: EM | Admit: 2022-01-23 | Discharge: 2022-01-23 | Disposition: A | Discharge status: Home / Self Care | Payer: BC Managed Care – PPO | Attending: Emergency Medicine | Admitting: Emergency Medicine

## 2022-01-23 ENCOUNTER — Other Ambulatory Visit: Payer: Self-pay

## 2022-01-23 DIAGNOSIS — R42 Dizziness and giddiness: Secondary | ICD-10-CM

## 2022-01-23 DIAGNOSIS — D72829 Elevated white blood cell count, unspecified: Secondary | ICD-10-CM | POA: Diagnosis not present

## 2022-01-23 LAB — BASIC METABOLIC PANEL
Anion gap: 9 (ref 5–15)
BUN: 20 mg/dL (ref 6–20)
CO2: 24 mmol/L (ref 22–32)
Calcium: 8.6 mg/dL — ABNORMAL LOW (ref 8.9–10.3)
Chloride: 102 mmol/L (ref 98–111)
Creatinine, Ser: 0.72 mg/dL (ref 0.61–1.24)
GFR, Estimated: >60 mL/min (ref >60)
Glucose, Bld: 109 mg/dL — ABNORMAL HIGH (ref 70–99)
Potassium: 3.5 mmol/L (ref 3.5–5.1)
Sodium: 135 mmol/L (ref 135–145)

## 2022-01-23 LAB — CBC
HCT: 46.7 % (ref 39.0–52.0)
Hemoglobin: 16.1 g/dL (ref 13.0–17.0)
MCH: 33.5 pg (ref 26.0–34.0)
MCHC: 34.5 g/dL (ref 30.0–36.0)
MCV: 97.3 fL (ref 80.0–100.0)
Platelets: 271 10*3/uL (ref 150–400)
RBC: 4.8 MIL/uL (ref 4.22–5.81)
RDW: 14.2 % (ref 11.5–15.5)
WBC: 13.2 10*3/uL — ABNORMAL HIGH (ref 4.0–10.5)
nRBC: 0 % (ref 0.0–0.2)

## 2022-01-23 MED ORDER — MECLIZINE HCL 25 MG PO TABS: 25.0000 mg | ORAL_TABLET | Freq: Three times a day (TID) | ORAL | 0 refills | Status: DC | PRN

## 2022-01-23 MED ORDER — MECLIZINE HCL 25 MG PO TABS
25.0000 mg | ORAL_TABLET | Freq: Once | ORAL | Status: AC
  Administered 2022-01-23: 25 mg via ORAL
  Filled 2022-01-23: qty 1

## 2022-01-23 NOTE — Discharge Instructions (Addendum)
It was a pleasure taking care of you today!   Your CT scan didn't show any emergent concerning findings at this time. Your labs showed slightly elevated white blood cells, likely due to your recent steroid use. You will be sent a prescription for antivert, use as directed. This medication may cause drowsiness. Maintain your scheduled follow up appointment with your ENT specialist for 02/11/22.  You may follow-up with your primary care provider regarding today's ED visit.  Return to the emergency department if you experience increasing/worsening symptoms.

## 2022-01-23 NOTE — ED Provider Notes (Signed)
[facility]-EMERGENCY DEPT Provider Note   CSN: [id number] Arrival date & time: 01/23/22  0113     History  Chief Complaint  Patient presents with   Dizziness    [name] [name] is a 57 y.o. male with a past medical history of vertigo who presents emergency department with concerns for dizziness onset 3 days.  Notes that he has had multiple episodes of dizziness with episodes lasting up typically from 10 minutes to 2 hours. Denies chest pain, lightheadedness, nausea, vomiting, numbness, tingling, weakness.  Patient has been treated with meclizine before for vertigo. Patient is ENT specialist is Dr. Dorma Russell  The history is provided by the patient. No language interpreter was used.       Home Medications Prior to Admission medications   Medication Sig Start Date End Date Taking? Authorizing Provider  meclizine (ANTIVERT) 25 MG tablet Take 1 tablet (25 mg total) by mouth 3 (three) times daily as needed for dizziness. 01/23/22  Yes Meris Reede A, PA-C  acyclovir ointment (ZOVIRAX) 5 % Apply 1 application topically every 3 (three) hours. 08/07/20   Daphine Deutscher Mary-Margaret, FNP  albuterol (VENTOLIN HFA) 108 (90 Base) MCG/ACT inhaler Inhale 2 puffs into the lungs every 6 (six) hours as needed for wheezing or shortness of breath. 12/30/21   Daphine Deutscher, Mary-Margaret, FNP  atorvastatin (LIPITOR) 40 MG tablet Take 1 tablet (40 mg total) by mouth at bedtime. 08/10/21   Daphine Deutscher, Mary-Margaret, FNP  benzonatate (TESSALON) 100 MG capsule Take 1 capsule (100 mg total) by mouth 3 (three) times daily as needed. 01/14/22   Margaretann Loveless, PA-C  cetirizine (ZYRTEC) 10 MG tablet Take 10 mg by mouth at bedtime.    [provider]  HYDROcodone bit-homatropine (HYCODAN) 5-1.5 MG/5ML syrup Take 5 mLs by mouth every 8 (eight) hours as needed for cough. 12/30/21   Daphine Deutscher, Mary-Margaret, FNP  ibuprofen (ADVIL) 200 MG tablet Take 800-1,200 mg by mouth every 8 (eight) hours as needed (for  pain.).    [provider]  pantoprazole (PROTONIX) 40 MG tablet Take 1 tablet (40 mg total) by mouth at bedtime. 08/10/21   Daphine Deutscher, Mary-Margaret, FNP  predniSONE (STERAPRED UNI-PAK 21 TAB) 10 MG (21) TBPK tablet 6 day taper; take as directed on package instructions 01/14/22   Margaretann Loveless, PA-C  sodium chloride (OCEAN) 0.65 % SOLN nasal spray Place 1 spray into both nostrils as needed for congestion. 12/22/21   Daryll Drown, NP      Allergies    Sulfa antibiotics, Sulfonamide derivatives, and Sertraline hcl    Review of Systems   Review of Systems  Neurological:  Positive for dizziness.  All other systems reviewed and are negative.   Physical Exam Updated Vital Signs BP (!) 133/95 (BP Location: Left Arm)   Pulse 85   Temp 98 F (36.7 C) (Oral)   Resp 18   Wt 117 kg   SpO2 96%   BMI 34.03 kg/m  Physical Exam Vitals and nursing note reviewed.  Constitutional:      General: He is not in acute distress.    Appearance: He is not diaphoretic.  HENT:     Head: Normocephalic and atraumatic.     Mouth/Throat:     Pharynx: No oropharyngeal exudate.  Eyes:     General: No scleral icterus.    Conjunctiva/sclera: Conjunctivae normal.     Comments: No nystagmus noted   Cardiovascular:     Rate and Rhythm: Normal rate and regular rhythm.  Pulses: Normal pulses.     Heart sounds: Normal heart sounds.  Pulmonary:     Effort: Pulmonary effort is normal. No respiratory distress.     Breath sounds: Normal breath sounds. No wheezing.  Abdominal:     General: Bowel sounds are normal.     Palpations: Abdomen is soft. There is no mass.     Tenderness: There is no abdominal tenderness. There is no guarding or rebound.  Musculoskeletal:        General: Normal range of motion.     Cervical back: Normal range of motion and neck supple.  Skin:    General: Skin is warm and dry.  Neurological:     General: No focal deficit present.     Mental Status: He is alert.      Cranial Nerves: Cranial nerves 2-12 are intact.     Sensory: Sensation is intact.     Motor: Motor function is intact. No pronator drift.     Coordination: Coordination is intact. Finger-Nose-Finger Test and Heel to [place] Test normal.     Gait: Gait is intact.     Comments: No focal neurological deficits. Negative pronator drift. Able to ambulate without assistance or difficulty. Strength and sensation intact to BUE and BLE. Grip strength 5/5 bilaterally.  Normal finger-nose testing.  Normal heel-to-shin testing.  Cranial nerves II through XII intact.  Psychiatric:        Behavior: Behavior normal.     ED Results / Procedures / Treatments   Labs (all labs ordered are listed, but only abnormal results are displayed) Labs Reviewed  BASIC METABOLIC PANEL - Abnormal; Notable for the following components:      Result Value   Glucose, Bld 109 (*)    Calcium 8.6 (*)    All other components within normal limits  CBC - Abnormal; Notable for the following components:   WBC 13.2 (*)    All other components within normal limits    EKG EKG Interpretation  Date/Time:  Sunday January 23 2022 01:22:34 EST Ventricular Rate:  71 PR Interval:  134 QRS Duration: 95 QT Interval:  424 QTC Calculation: 461 R Axis:   59 Text Interpretation: Sinus rhythm Borderline repolarization abnormality No previous ECGs available Confirmed by Shanon Rosser [phone]) on 01/23/2022 1:32:05 AM  Radiology CT Head Wo Contrast  Result Date: 01/23/2022 CLINICAL DATA:  Dizziness for 3 days EXAM: CT HEAD WITHOUT CONTRAST TECHNIQUE: Contiguous axial images were obtained from the base of the skull through the vertex without intravenous contrast. RADIATION DOSE REDUCTION: This exam was performed according to the departmental dose-optimization program which includes automated exposure control, adjustment of the mA and/or kV according to patient size and/or use of iterative reconstruction technique. COMPARISON:  No prior CT head  available, correlation is made with CT temporal bone 01/12/2027 FINDINGS: Brain: No evidence of acute infarction, hemorrhage, mass, mass effect, or midline shift. No hydrocephalus or extra-axial fluid collection. Vascular: No hyperdense vessel. Skull: Postsurgical changes in the bilateral mastoids. Right-sided anchor for hearing aid, which causes mild beam hardening artifact. Negative for fracture or focal lesion. Sinuses/Orbits: Clear paranasal sinuses. No acute finding in the orbits. Other: None. IMPRESSION: No acute intracranial process. Electronically Signed   By: Merilyn Baba M.D.   On: 01/23/2022 02:46    Procedures Procedures    Medications Ordered in ED Medications  meclizine (ANTIVERT) tablet 25 mg (25 mg Oral Given 01/23/22 0240)    ED Course/ Medical Decision Making/ A&P Clinical Course as  of 01/23/22 0357  Wynelle Link Jan 23, 2022  0330 Patient reevaluated [SB]  0334 Pt re-evaluated and noted improvement of symptoms with treatment regimen in the ED. Discussed with patient lab and imaging findings. Pt notes that he has been on 3 rounds of steroids within the past several months due to URI like symptoms that have been improving. Likely the cause of the leukocytosis today. Pt has a follow up appointment with his ENT specialist, Dr. Dorma Russell on 02/11/22. Answered all available questions. Pt appears safe for discharge at this time.  [SB]    Clinical Course User Index [SB] Brixton Franko A, PA-C                           Medical Decision Making Amount and/or Complexity of Data Reviewed Labs: ordered. Radiology: ordered.   Pt presents with concerns for dizziness onset 3-4 days. Vital signs, pt afebrile. On exam, pt with no focal neurological deficits.  Able to ambulate without assistance or difficulty.  Normal finger-nose testing.  Normal heel-to-shin testing.  Negative pronator drift. No acute cardiovascular, respiratory exam findings. Differential diagnosis includes CVA, TIA, vertigo, arrhythmia,  electrolyte abnormality.    Labs:  I ordered, and personally interpreted labs.  The pertinent results include:   CBC with leukocytosis, patient recently completed a course of steroids last week BMP negative  Imaging: I ordered imaging studies including CT head without I independently visualized and interpreted imaging which showed: No acute intracranial findings I agree with the radiologist interpretation  Medications:  I ordered medication including Antivert for symptom management Reevaluation of the patient after these medicines and interventions, I reevaluated the patient and found that they have improved I have reviewed the patients home medicines and have made adjustments as needed  Disposition: Presentation suspicious for vertigo.  Doubt at this time concerns for CVA, TIA, intracranial abnormality, arrhythmia, electrolyte abnormality. After consideration of the diagnostic results and the patients response to treatment, I feel that the patient would benefit from Discharge home. Supportive care measures and strict return precautions discussed with patient at bedside. Pt acknowledges and verbalizes understanding. Pt appears safe for discharge. Follow up as indicated in discharge paperwork.    This chart was dictated using voice recognition software, Dragon. Despite the best efforts of this provider to proofread and correct errors, errors may still occur which can change documentation meaning.  Final Clinical Impression(s) / ED Diagnoses Final diagnoses:  Vertigo    Rx / DC Orders ED Discharge Orders          Ordered    meclizine (ANTIVERT) 25 MG tablet  3 times daily PRN        01/23/22 0348              Roselind Klus A, PA-C 01/23/22 0357    Molpus, Jonny Ruiz, MD 01/23/22 [phone]

## 2022-01-23 NOTE — ED Triage Notes (Addendum)
Episodes of dizziness for the past 3 days, episodes usually last 10 min to 2 hrs and then resolve completely. Was evaluated by EMS in [facility] but opted for POV transport. 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 This procedure sometimes effects his equilibrium. Today his ear feels "hot" NIH 0 in triage No recent falls.

## 2022-02-10 ENCOUNTER — Ambulatory Visit (INDEPENDENT_AMBULATORY_CARE_PROVIDER_SITE_OTHER): Payer: BC Managed Care – PPO | Admitting: Nurse Practitioner

## 2022-02-10 ENCOUNTER — Encounter: Payer: Self-pay | Admitting: Nurse Practitioner

## 2022-02-10 VITALS — BP 129/89 | HR 88 | Temp 98.1°F | Resp 20 | Ht 73.0 in | Wt 258.0 lb

## 2022-02-10 DIAGNOSIS — J40 Bronchitis, not specified as acute or chronic: Secondary | ICD-10-CM

## 2022-02-10 DIAGNOSIS — K219 Gastro-esophageal reflux disease without esophagitis: Secondary | ICD-10-CM | POA: Diagnosis not present

## 2022-02-10 DIAGNOSIS — Z0001 Encounter for general adult medical examination with abnormal findings: Secondary | ICD-10-CM

## 2022-02-10 DIAGNOSIS — F17200 Nicotine dependence, unspecified, uncomplicated: Secondary | ICD-10-CM | POA: Diagnosis not present

## 2022-02-10 DIAGNOSIS — Z0000 Encounter for general adult medical examination without abnormal findings: Secondary | ICD-10-CM

## 2022-02-10 DIAGNOSIS — Z6833 Body mass index (BMI) 33.0-33.9, adult: Secondary | ICD-10-CM

## 2022-02-10 DIAGNOSIS — E785 Hyperlipidemia, unspecified: Secondary | ICD-10-CM

## 2022-02-10 MED ORDER — PANTOPRAZOLE SODIUM 40 MG PO TBEC: 40.0000 mg | DELAYED_RELEASE_TABLET | Freq: Every day | ORAL | 1 refills | Status: DC

## 2022-02-10 MED ORDER — BUDESONIDE-FORMOTEROL FUMARATE 80-4.5 MCG/ACT IN AERO: 2.0000 | INHALATION_SPRAY | Freq: Two times a day (BID) | RESPIRATORY_TRACT | 3 refills | Status: DC

## 2022-02-10 MED ORDER — ALBUTEROL SULFATE HFA 108 (90 BASE) MCG/ACT IN AERS: 2.0000 | INHALATION_SPRAY | Freq: Four times a day (QID) | RESPIRATORY_TRACT | 0 refills | Status: AC | PRN

## 2022-02-10 MED ORDER — ATORVASTATIN CALCIUM 40 MG PO TABS: 40.0000 mg | ORAL_TABLET | Freq: Every day | ORAL | 1 refills | Status: DC

## 2022-02-10 NOTE — Addendum Note (Signed)
Addended by: Chevis Pretty on: 02/10/2022 09:05 AM   Modules accepted: Orders

## 2022-02-10 NOTE — Patient Instructions (Signed)
Smokeless Tobacco Information, Adult  Tobacco is a leafy plant that contains a chemical called nicotine. Tobacco use is harmful to your health. Nicotine affects the chemicals in your brain, and this can cause you to crave nicotine. These cravings can lead to addiction. Smokeless tobacco is tobacco that you put in your mouth instead of smoking it. It may also be called chewing tobacco or snuff. Smokeless tobacco is made from the leaves of tobacco plants and comes in many forms, such as: Loose, dry leaves. Plugs or twists. Moist pouches. Dissolving lozenges or strips. Chewing, sucking, or holding the tobacco in your mouth causes your mouth to make more saliva. The saliva mixes with the tobacco to make tobacco "juice" that is swallowed or spit out. How can smokeless tobacco use affect me? All forms of tobacco contain many chemicals that can harm every organ in the body. Using smokeless tobacco increases your risk for: Cancer. Tobacco use is one of the leading causes of cancer. Smokeless tobacco is linked to cancer of the mouth, esophagus, and pancreas. Other long-term health problems, including high blood pressure, heart disease, and stroke. Addiction. Pregnancy complications. Pregnant women who use smokeless tobacco are more likely to have a miscarriage or deliver a baby too early (premature delivery). Mouth and dental problems, such as: Bad breath. Teeth that look yellow or brown. Mouth sores. Cracking and bleeding lips. Gum recession, gum disease, or tooth decay. Lesions on the soft tissues of your mouth (leukoplakia). The benefits of not using smokeless tobacco include: A healthy mind and body. Saving money. You avoid the cost of buying tobacco and the cost of treating illnesses that are caused by tobacco. What actions can I take to quit using tobacco? Ask your health care provider for help to quit using smokeless tobacco. This may involve treatment. These tips may also help you  quit: Pick a date to quit. Set a date within the next 2 weeks. This gives you time to prepare. Write down the reasons why you are quitting. Keep this list in places where you will see it often, such as on your bathroom mirror or in your car or wallet. Identify the people, places, things, and activities that make you want to use smokeless tobacco (triggers). Avoid them. Tell your family and friends that you are quitting. Look for a support group in your area. Having support can make quitting easier. Get rid of any tobacco you have and remove any tobacco smells. To do this: Throw away all containers of tobacco at home, at work, and in your car. Throw away any other items that you use regularly when you chew tobacco. Clean your car and make sure to remove all tobacco-related items. Clean your home, including curtains and carpets. Keep track of how many days have passed since you quit. It may help to cross days off a calendar. Remembering how long and hard you have worked to quit can help you avoid using smokeless tobacco again. Stay positive. Be prepared for cravings. It is common to slip up when you first quit, so take it one day at a time. Stay busy and take care of your body. Get plenty of exercise. Drink enough water to keep your urine pale yellow. Where to find support Ask your health care provider if there is a local support group for quitting smokeless tobacco and any available online resources, such as: Smoke Free: www.veterans.smokefree.gov Be Tobacco Free: http://mullen.biz/ Tobacco Quitline: 1-855-QUIT-VET (501) 737-1256). Hotlines, such as 1-800-QUIT-NOW 215 357 0771). Where to find more information  You can learn more about the risks of using smokeless tobacco and the benefits of quitting from these sources: [place]: www.cancer.[place]: www.cancer.gov Centers for Disease Control and Prevention: http://www.wolf.info/ Food and Drug Administration:  VCFever.ch Contact a health care provider if you have: Trouble quitting smokeless tobacco use on your own. White or other discolored patches in your mouth. Difficulty swallowing. A change in your voice. Unexplained weight loss. Stomach pain, nausea, or vomiting. Summary Smokeless tobacco contains many different chemicals that are known to cause cancer. Nicotine is an addictive chemical in smokeless tobacco that affects your brain. The benefits of not using smokeless tobacco include having a healthy mind and body and saving money. Tell your family and friends that you are quitting. Having support can make quitting easier. Ask your health care provider for help quitting smokeless tobacco. This may involve treatment. This information is not intended to replace advice given to you by your health care provider. Make sure you discuss any questions you have with your health care provider. Document Revised: 09/30/2020 Document Reviewed: 09/30/2020 Elsevier Patient Education  [place].

## 2022-02-10 NOTE — Progress Notes (Signed)
Subjective:    Patient ID: [name] [name], male    DOB: 1965-03-20, 57 y.o.   MRN: [id number]   Chief Complaint: medical management of chronic issues     HPI:  [name] [name] is a 57 y.o. who identifies as a male who was assigned male at birth.   Social history: Lives with: wife and kids Work history: works in [employer].   Comes in today for follow up of the following chronic medical issues:  1. Hyperlipidemia with target LDL less than 100 Does not really watch diet and does no dedicated exercise. Lab Results  Component Value Date   CHOL 159 08/10/2021   HDL 30 (L) 08/10/2021   LDLCALC 108 (H) 08/10/2021   TRIG 116 08/10/2021   CHOLHDL 5.3 (H) 08/10/2021     2. Gastroesophageal reflux disease without esophagitis Is on protonix daily and is doing well.  3. snuff Dips several times a day  4. BMI 33.0-33.9,adult No recent weight changes Wt Readings from Last 3 Encounters:  02/10/22 258 lb (117 kg)  01/23/22 257 lb 15 oz (117 kg)  12/30/21 259 lb (117.5 kg)   BMI Readings from Last 3 Encounters:  02/10/22 34.04 kg/m  01/23/22 34.03 kg/m  12/30/21 34.17 kg/m     New complaints: He has been using his rescue inhaler 5-6 x a night at his new job. Needs maintenance inhaler. He use to be on symbicort.   Allergies  Allergen Reactions   Sulfa Antibiotics Rash   Sulfonamide Derivatives Rash   Sertraline Hcl Nausea And Vomiting    Gi upset    Outpatient Encounter Medications as of 02/10/2022  Medication Sig   acyclovir ointment (ZOVIRAX) 5 % Apply 1 application topically every 3 (three) hours.   albuterol (VENTOLIN HFA) 108 (90 Base) MCG/ACT inhaler Inhale 2 puffs into the lungs every 6 (six) hours as needed for wheezing or shortness of breath.   atorvastatin (LIPITOR) 40 MG tablet Take 1 tablet (40 mg total) by mouth at bedtime.   benzonatate (TESSALON) 100 MG capsule Take 1 capsule (100 mg total) by mouth 3 (three) times daily as needed.   cetirizine  (ZYRTEC) 10 MG tablet Take 10 mg by mouth at bedtime.   HYDROcodone bit-homatropine (HYCODAN) 5-1.5 MG/5ML syrup Take 5 mLs by mouth every 8 (eight) hours as needed for cough.   ibuprofen (ADVIL) 200 MG tablet Take 800-1,200 mg by mouth every 8 (eight) hours as needed (for pain.).   meclizine (ANTIVERT) 25 MG tablet Take 1 tablet (25 mg total) by mouth 3 (three) times daily as needed for dizziness.   pantoprazole (PROTONIX) 40 MG tablet Take 1 tablet (40 mg total) by mouth at bedtime.   predniSONE (STERAPRED UNI-PAK 21 TAB) 10 MG (21) TBPK tablet 6 day taper; take as directed on package instructions   sodium chloride (OCEAN) 0.65 % SOLN nasal spray Place 1 spray into both nostrils as needed for congestion.   No facility-administered encounter medications on file as of 02/10/2022.    Past Surgical History:  Procedure Laterality Date   CHOLESTEATOMA EXCISION  1994   COLONOSCOPY N/A 11/09/2018   Procedure: COLONOSCOPY;  Surgeon: Daneil Dolin, MD;  Location: [facility];  Service: Endoscopy;  Laterality: N/A;  12:00   Left ear surgery     Right ear surgery     right knee arthroscopy      Family History  Problem Relation Age of Onset   Colon cancer Neg Hx  Controlled substance contract: n/a     Review of Systems  Constitutional:  Negative for diaphoresis.  Eyes:  Negative for pain.  Respiratory:  Negative for shortness of breath.   Cardiovascular:  Negative for chest pain, palpitations and leg swelling.  Gastrointestinal:  Negative for abdominal pain.  Endocrine: Negative for polydipsia.  Skin:  Negative for rash.  Neurological:  Negative for dizziness, weakness and headaches.  Hematological:  Does not bruise/bleed easily.  All other systems reviewed and are negative.      Objective:   Physical Exam Vitals and nursing note reviewed.  Constitutional:      Appearance: Normal appearance. He is well-developed.  HENT:     Head: Normocephalic.     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     Left Ear: Tympanic membrane is scarred.     Ears:     Comments: Right cochlear implant    Nose: Nose normal.     Mouth/Throat:     Mouth: Mucous membranes are moist.     Pharynx: Oropharynx is clear.  Eyes:     Pupils: Pupils are equal, round, and reactive to light.  Neck:     Thyroid: No thyroid mass or thyromegaly.     Vascular: No carotid bruit or JVD.     Trachea: Phonation normal.  Cardiovascular:     Rate and Rhythm: Normal rate and regular rhythm.  Pulmonary:     Effort: Pulmonary effort is normal. No respiratory distress.     Breath sounds: Normal breath sounds.  Abdominal:     General: Bowel sounds are normal.     Palpations: Abdomen is soft.     Tenderness: There is no abdominal tenderness.  Musculoskeletal:        General: Normal range of motion.     Cervical back: Normal range of motion and neck supple.  Lymphadenopathy:     Cervical: No cervical adenopathy.  Skin:    General: Skin is warm and dry.  Neurological:     Mental Status: He is alert and oriented to person, place, and time.  Psychiatric:        Behavior: Behavior normal.        Thought Content: Thought content normal.        Judgment: Judgment normal.     BP 129/89   Pulse 88   Temp 98.1 F (36.7 C) (Temporal)   Resp 20   Ht 6\' 1"  (1.854 m)   Wt 258 lb (117 kg)   SpO2 95%   BMI 34.04 kg/m        Assessment & Plan:   [name] [name] comes in today with chief complaint of Annual Exam   Diagnosis and orders addressed:  1. Annual physical exam  - VITAMIN D 25 Hydroxy (Vit-D Deficiency, Fractures)  2. Hyperlipidemia with target LDL less than 100 Low fat diet - CBC with Differential/Platelet - CMP14+EGFR - Lipid panel  3. Gastroesophageal reflux disease without esophagitis Avoid spicy foods Do not eat 2 hours prior to bedtime  4. snuff Encouraged to stop  5. BMI 33.0-33.9,adult Discussed diet and exercise for person with  BMI >25 Will recheck weight in 3-6 months    Labs pending Health Maintenance reviewed Diet and exercise encouraged  Follow up plan: 6 months   Mary-Margaret Hassell Done, FNP

## 2022-02-11 LAB — CMP14+EGFR
ALT: 15 IU/L (ref 0–44)
AST: 14 IU/L (ref 0–40)
Albumin/Globulin Ratio: 1.7 (ref 1.2–2.2)
Albumin: 4.2 g/dL (ref 3.8–4.9)
Alkaline Phosphatase: 140 IU/L — ABNORMAL HIGH (ref 44–121)
BUN/Creatinine Ratio: 18 (ref 9–20)
BUN: 16 mg/dL (ref 6–24)
Bilirubin Total: 0.4 mg/dL (ref 0.0–1.2)
CO2: 20 mmol/L (ref 20–29)
Calcium: 8.9 mg/dL (ref 8.7–10.2)
Chloride: 103 mmol/L (ref 96–106)
Creatinine, Ser: 0.89 mg/dL (ref 0.76–1.27)
Globulin, Total: 2.5 g/dL (ref 1.5–4.5)
Glucose: 100 mg/dL — ABNORMAL HIGH (ref 70–99)
Potassium: 3.9 mmol/L (ref 3.5–5.2)
Sodium: 139 mmol/L (ref 134–144)
Total Protein: 6.7 g/dL (ref 6.0–8.5)
eGFR: 102 mL/min/{1.73_m2} (ref >59)

## 2022-02-11 LAB — CBC WITH DIFFERENTIAL/PLATELET
Basophils Absolute: 0.1 10*3/uL (ref 0.0–0.2)
Basos: 1 %
EOS (ABSOLUTE): 0.3 10*3/uL (ref 0.0–0.4)
Eos: 3 %
Hematocrit: 47.5 % (ref 37.5–51.0)
Hemoglobin: 16 g/dL (ref 13.0–17.7)
Immature Grans (Abs): 0.1 10*3/uL (ref 0.0–0.1)
Immature Granulocytes: 1 %
Lymphocytes Absolute: 3.7 10*3/uL — ABNORMAL HIGH (ref 0.7–3.1)
Lymphs: 32 %
MCH: 33.4 pg — ABNORMAL HIGH (ref 26.6–33.0)
MCHC: 33.7 g/dL (ref 31.5–35.7)
MCV: 99 fL — ABNORMAL HIGH (ref 79–97)
Monocytes Absolute: 0.9 10*3/uL (ref 0.1–0.9)
Monocytes: 8 %
Neutrophils Absolute: 6.6 10*3/uL (ref 1.4–7.0)
Neutrophils: 55 %
Platelets: 234 10*3/uL (ref 150–450)
RBC: 4.79 x10E6/uL (ref 4.14–5.80)
RDW: 12.9 % (ref 11.6–15.4)
WBC: 11.7 10*3/uL — ABNORMAL HIGH (ref 3.4–10.8)

## 2022-02-11 LAB — LIPID PANEL
Chol/HDL Ratio: 4.9 ratio (ref 0.0–5.0)
Cholesterol, Total: 161 mg/dL (ref 100–199)
HDL: 33 mg/dL — ABNORMAL LOW (ref >39)
LDL Chol Calc (NIH): 110 mg/dL — ABNORMAL HIGH (ref 0–99)
Triglycerides: 96 mg/dL (ref 0–149)
VLDL Cholesterol Cal: 18 mg/dL (ref 5–40)

## 2022-02-11 LAB — VITAMIN D 25 HYDROXY (VIT D DEFICIENCY, FRACTURES): Vit D, 25-Hydroxy: 14.4 ng/mL — ABNORMAL LOW (ref 30.0–100.0)

## 2022-02-14 ENCOUNTER — Telehealth: Payer: Self-pay | Admitting: Nurse Practitioner

## 2022-02-14 DIAGNOSIS — J40 Bronchitis, not specified as acute or chronic: Secondary | ICD-10-CM

## 2022-02-14 MED ORDER — FLUTICASONE FUROATE-VILANTEROL 100-25 MCG/ACT IN AEPB: 1.0000 | INHALATION_SPRAY | Freq: Every day | RESPIRATORY_TRACT | 11 refills | Status: DC

## 2022-02-14 NOTE — Telephone Encounter (Signed)
Pharmacy called stating that they sent [place] a message last Thursday regarding pts Symbicort Rx but has not heard back from anyone.   Says Symbicort is not covered by pts insurance but Breo or Advair is if PCP wants to switch pt to taking one of those.   Please advise and sent to pharmacy.

## 2022-02-14 NOTE — Telephone Encounter (Signed)
Detailed message left for patient.

## 2022-02-14 NOTE — Telephone Encounter (Signed)
Changed symbicort to [facility]

## 2022-02-15 NOTE — Addendum Note (Signed)
Addended by: Chevis Pretty on: 02/15/2022 01:38 PM   Modules accepted: Level of Service

## 2022-08-12 ENCOUNTER — Ambulatory Visit: Payer: BC Managed Care – PPO | Admitting: Nurse Practitioner

## 2022-08-12 ENCOUNTER — Encounter: Payer: Self-pay | Admitting: Nurse Practitioner

## 2022-08-12 VITALS — BP 130/85 | HR 78 | Temp 97.1°F | Resp 20 | Ht 73.0 in | Wt 255.0 lb

## 2022-08-12 DIAGNOSIS — E785 Hyperlipidemia, unspecified: Secondary | ICD-10-CM | POA: Diagnosis not present

## 2022-08-12 DIAGNOSIS — K219 Gastro-esophageal reflux disease without esophagitis: Secondary | ICD-10-CM | POA: Diagnosis not present

## 2022-08-12 DIAGNOSIS — Z87891 Personal history of nicotine dependence: Secondary | ICD-10-CM | POA: Diagnosis not present

## 2022-08-12 DIAGNOSIS — Z6833 Body mass index (BMI) 33.0-33.9, adult: Secondary | ICD-10-CM

## 2022-08-12 DIAGNOSIS — J40 Bronchitis, not specified as acute or chronic: Secondary | ICD-10-CM

## 2022-08-12 LAB — CBC WITH DIFFERENTIAL/PLATELET
Basophils Absolute: 0.1 10*3/uL (ref 0.0–0.2)
Basos: 1 %
EOS (ABSOLUTE): 0.3 10*3/uL (ref 0.0–0.4)
Eos: 3 %
Hematocrit: 46.4 % (ref 37.5–51.0)
Hemoglobin: 16.3 g/dL (ref 13.0–17.7)
Immature Grans (Abs): 0.1 10*3/uL (ref 0.0–0.1)
Immature Granulocytes: 1 %
Lymphocytes Absolute: 3.2 10*3/uL — ABNORMAL HIGH (ref 0.7–3.1)
Lymphs: 27 %
MCH: 33.5 pg — ABNORMAL HIGH (ref 26.6–33.0)
MCHC: 35.1 g/dL (ref 31.5–35.7)
MCV: 96 fL (ref 79–97)
Monocytes Absolute: 1 10*3/uL — ABNORMAL HIGH (ref 0.1–0.9)
Monocytes: 9 %
Neutrophils Absolute: 7.1 10*3/uL — ABNORMAL HIGH (ref 1.4–7.0)
Neutrophils: 59 %
Platelets: 242 10*3/uL (ref 150–450)
RBC: 4.86 x10E6/uL (ref 4.14–5.80)
RDW: 12.4 % (ref 11.6–15.4)
WBC: 11.8 10*3/uL — ABNORMAL HIGH (ref 3.4–10.8)

## 2022-08-12 LAB — CMP14+EGFR
ALT: 12 IU/L (ref 0–44)
AST: 12 IU/L (ref 0–40)
Alkaline Phosphatase: 168 IU/L — ABNORMAL HIGH (ref 44–121)
BUN/Creatinine Ratio: 15 (ref 9–20)
BUN: 13 mg/dL (ref 6–24)
Bilirubin Total: 0.5 mg/dL (ref 0.0–1.2)
CO2: 22 mmol/L (ref 20–29)
Calcium: 9.1 mg/dL (ref 8.7–10.2)
Chloride: 104 mmol/L (ref 96–106)
Creatinine, Ser: 0.85 mg/dL (ref 0.76–1.27)
Globulin, Total: 2.6 g/dL (ref 1.5–4.5)
Glucose: 96 mg/dL (ref 70–99)
Potassium: 3.8 mmol/L (ref 3.5–5.2)
Sodium: 139 mmol/L (ref 134–144)
Total Protein: 6.5 g/dL (ref 6.0–8.5)
eGFR: 103 mL/min/{1.73_m2} (ref >59)

## 2022-08-12 LAB — THYROID PANEL WITH TSH

## 2022-08-12 LAB — LIPID PANEL
Chol/HDL Ratio: 5.5 ratio — ABNORMAL HIGH (ref 0.0–5.0)
Cholesterol, Total: 172 mg/dL (ref 100–199)
HDL: 31 mg/dL — ABNORMAL LOW (ref >39)
LDL Chol Calc (NIH): 126 mg/dL — ABNORMAL HIGH (ref 0–99)
Triglycerides: 81 mg/dL (ref 0–149)
VLDL Cholesterol Cal: 15 mg/dL (ref 5–40)

## 2022-08-12 MED ORDER — PANTOPRAZOLE SODIUM 40 MG PO TBEC: 40.0000 mg | DELAYED_RELEASE_TABLET | Freq: Every day | ORAL | 1 refills | Status: DC

## 2022-08-12 MED ORDER — FLUTICASONE FUROATE-VILANTEROL 100-25 MCG/ACT IN AEPB: 1.0000 | INHALATION_SPRAY | Freq: Every day | RESPIRATORY_TRACT | 11 refills | Status: DC

## 2022-08-12 MED ORDER — ATORVASTATIN CALCIUM 40 MG PO TABS: 40.0000 mg | ORAL_TABLET | Freq: Every day | ORAL | 1 refills | Status: DC

## 2022-08-12 NOTE — Progress Notes (Signed)
Subjective:    Patient ID: [name] [name], male    DOB: [birth year], 57 y.o.   MRN: [id number]   Chief Complaint: medical management of chronic issues     HPI:  [name] [name] is a 57 y.o. who identifies as a male who was assigned male at birth.   Social history: Lives with: wife and kids Work history: works in [employer] in today for follow up of the following chronic medical issues:  1. Hyperlipidemia with target LDL less than 100 Does not watch diet and does no dedicated exercise. Lab Results  Component Value Date   CHOL 161 02/10/2022   HDL 33 (L) 02/10/2022   LDLCALC 110 (H) 02/10/2022   TRIG 96 02/10/2022   CHOLHDL 4.9 02/10/2022   The 10-year ASCVD risk score (Arnett DK, et al., 2019) is: 5.9%   2. Gastroesophageal reflux disease without esophagitis Is on protonix daily and is doing well.  3. snuff Quit in August of last year  4. BMI 33.0-33.9,adult Weight down 3 lbs Wt Readings from Last 3 Encounters:  08/12/22 255 lb (115.7 kg)  02/10/22 258 lb (117 kg)  01/23/22 257 lb 15 oz (117 kg)   BMI Readings from Last 3 Encounters:  08/12/22 33.64 kg/m  02/10/22 34.04 kg/m  01/23/22 34.03 kg/m     New complaints: None today  Allergies  Allergen Reactions   Sulfa Antibiotics Rash   Sulfonamide Derivatives Rash   Sertraline Hcl Nausea And Vomiting    Gi upset    Outpatient Encounter Medications as of 08/12/2022  Medication Sig   acyclovir ointment (ZOVIRAX) 5 % Apply 1 application topically every 3 (three) hours.   albuterol (VENTOLIN HFA) 108 (90 Base) MCG/ACT inhaler Inhale 2 puffs into the lungs every 6 (six) hours as needed for wheezing or shortness of breath.   atorvastatin (LIPITOR) 40 MG tablet Take 1 tablet (40 mg total) by mouth at bedtime.   cetirizine (ZYRTEC) 10 MG tablet Take 10 mg by mouth at bedtime.   fluticasone furoate-vilanterol (BREO ELLIPTA) 100-25 MCG/ACT AEPB Inhale 1 puff into the lungs daily.   ibuprofen  (ADVIL) 200 MG tablet Take 800-1,200 mg by mouth every 8 (eight) hours as needed (for pain.).   meclizine (ANTIVERT) 25 MG tablet Take 1 tablet (25 mg total) by mouth 3 (three) times daily as needed for dizziness.   pantoprazole (PROTONIX) 40 MG tablet Take 1 tablet (40 mg total) by mouth at bedtime.   sodium chloride (OCEAN) 0.65 % SOLN nasal spray Place 1 spray into both nostrils as needed for congestion.   No facility-administered encounter medications on file as of 08/12/2022.    Past Surgical History:  Procedure Laterality Date   CHOLESTEATOMA EXCISION  1994   COLONOSCOPY N/A 11/09/2018   Procedure: COLONOSCOPY;  Surgeon: Corbin Ade, MD;  Location: [facility];  Service: Endoscopy;  Laterality: N/A;  12:00   Left ear surgery     Right ear surgery     right knee arthroscopy      Family History  Problem Relation Age of Onset   Colon cancer Neg Hx       Controlled substance contract: n/a     Review of Systems  Constitutional:  Negative for diaphoresis.  Eyes:  Negative for pain.  Respiratory:  Negative for shortness of breath.   Cardiovascular:  Negative for chest pain, palpitations and leg swelling.  Gastrointestinal:  Negative for abdominal pain.  Endocrine: Negative for polydipsia.  Skin:  Negative for rash.  Neurological:  Negative for dizziness, weakness and headaches.  Hematological:  Does not bruise/bleed easily.  All other systems reviewed and are negative.      Objective:   Physical Exam Vitals and nursing note reviewed.  Constitutional:      Appearance: Normal appearance. He is well-developed.  HENT:     Head: Normocephalic.     Nose: Nose normal.     Mouth/Throat:     Mouth: Mucous membranes are moist.     Pharynx: Oropharynx is clear.  Eyes:     Pupils: Pupils are equal, round, and reactive to light.  Neck:     Thyroid: No thyroid mass or thyromegaly.     Vascular: No carotid bruit or JVD.     Trachea: Phonation normal.  Cardiovascular:      Rate and Rhythm: Normal rate and regular rhythm.  Pulmonary:     Effort: Pulmonary effort is normal. No respiratory distress.     Breath sounds: Normal breath sounds.  Abdominal:     General: Bowel sounds are normal.     Palpations: Abdomen is soft.     Tenderness: There is no abdominal tenderness.  Musculoskeletal:        General: Normal range of motion.     Cervical back: Normal range of motion and neck supple.  Lymphadenopathy:     Cervical: No cervical adenopathy.  Skin:    General: Skin is warm and dry.  Neurological:     Mental Status: He is alert and oriented to person, place, and time.  Psychiatric:        Behavior: Behavior normal.        Thought Content: Thought content normal.        Judgment: Judgment normal.     BP 130/85   Pulse 78   Temp (!) 97.1 F (36.2 C) (Temporal)   Resp 20   Ht 6\' 1"  (1.854 m)   Wt 255 lb (115.7 kg)   SpO2 96%   BMI 33.64 kg/m        Assessment & Plan:  [name] [name] comes in today with chief complaint of Medical Management of Chronic Issues   Diagnosis and orders addressed:  1. Hyperlipidemia with target LDL less than 100 Low fat diet - atorvastatin (LIPITOR) 40 MG tablet; Take 1 tablet (40 mg total) by mouth at bedtime.  Dispense: 90 tablet; Refill: 1 - CBC with Differential/Platelet - CMP14+EGFR - Lipid panel - Thyroid Panel With TSH  2. Gastroesophageal reflux disease without esophagitis Avoid spicy foods Do not eat 2 hours prior to bedtime - pantoprazole (PROTONIX) 40 MG tablet; Take 1 tablet (40 mg total) by mouth at bedtime.  Dispense: 90 tablet; Refill: 1  3. Hx of tobacco use, presenting hazards to health Continue  to avoid all tobacco products  4. BMI 33.0-33.9,adult Discussed diet and exercise for person with BMI >25 Will recheck weight in 3-6 months   5. Bronchitis - fluticasone furoate-vilanterol (BREO ELLIPTA) 100-25 MCG/ACT AEPB; Inhale 1 puff into the lungs daily.  Dispense: 1 each;  Refill: 11   Labs pending Health Maintenance reviewed Diet and exercise encouraged  Follow up plan: 6 months   [name] Daphine Deutscher, FNP

## 2022-08-13 LAB — CMP14+EGFR: Albumin: 3.9 g/dL (ref 3.8–4.9)

## 2022-11-14 DIAGNOSIS — Z23 Encounter for immunization: Secondary | ICD-10-CM | POA: Diagnosis not present

## 2022-12-08 ENCOUNTER — Telehealth: Payer: BC Managed Care – PPO | Admitting: Physician Assistant

## 2022-12-08 ENCOUNTER — Ambulatory Visit: Payer: BC Managed Care – PPO | Admitting: Family Medicine

## 2022-12-08 ENCOUNTER — Encounter: Payer: Self-pay | Admitting: Family Medicine

## 2022-12-08 VITALS — BP 118/70 | HR 137 | Temp 103.2°F | Ht 73.0 in | Wt 251.0 lb

## 2022-12-08 DIAGNOSIS — Z9621 Cochlear implant status: Secondary | ICD-10-CM | POA: Diagnosis not present

## 2022-12-08 DIAGNOSIS — H6693 Otitis media, unspecified, bilateral: Secondary | ICD-10-CM | POA: Diagnosis not present

## 2022-12-08 DIAGNOSIS — J069 Acute upper respiratory infection, unspecified: Secondary | ICD-10-CM

## 2022-12-08 DIAGNOSIS — R509 Fever, unspecified: Secondary | ICD-10-CM

## 2022-12-08 DIAGNOSIS — H6690 Otitis media, unspecified, unspecified ear: Secondary | ICD-10-CM

## 2022-12-08 LAB — VERITOR FLU A/B WAIVED
Influenza A: NEGATIVE
Influenza B: NEGATIVE

## 2022-12-08 MED ORDER — AMOXICILLIN 500 MG PO CAPS: 500.0000 mg | ORAL_CAPSULE | Freq: Two times a day (BID) | ORAL | 0 refills | Status: DC

## 2022-12-08 MED ORDER — FLUTICASONE PROPIONATE 50 MCG/ACT NA SUSP: 2.0000 | Freq: Every day | NASAL | 6 refills | Status: DC

## 2022-12-08 MED ORDER — AMOXICILLIN-POT CLAVULANATE 875-125 MG PO TABS: 1.0000 | ORAL_TABLET | Freq: Two times a day (BID) | ORAL | 0 refills | Status: AC

## 2022-12-08 MED ORDER — CEFTRIAXONE SODIUM 1 G IJ SOLR
1.0000 g | Freq: Once | INTRAMUSCULAR | Status: AC
  Administered 2022-12-08: 1 g via INTRAMUSCULAR

## 2022-12-08 NOTE — Progress Notes (Signed)

## 2022-12-08 NOTE — Progress Notes (Signed)
Acute Office Visit  Subjective:     Patient ID: [name] [name], male    DOB: [birth year], 57 y.o.   MRN: [id number]  Chief Complaint  Patient presents with   Fever    Otalgia  There is pain in both ears. This is a new problem. Episode onset: 2 days. The problem occurs constantly. The problem has been unchanged. The maximum temperature recorded prior to his arrival was 103 - 104 F. Associated symptoms include coughing, diarrhea and rhinorrhea. Pertinent negatives include no abdominal pain, ear discharge, headaches, neck pain, rash, sore throat or vomiting. Associated symptoms comments: Nasal congestion. He has tried nothing for the symptoms. His past medical history is significant for a chronic ear infection and hearing loss.   Hx of multiple ear surgeries. He has has an ENT with [facility] (Dr. Dorma Russell). 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 Hx of right mastoidectomy.   He did an E-visit earlier today. He was prescribed amoxicillin and flonase. He did not start these however. He wasn't to be seen in person first.   Review of Systems  HENT:  Positive for ear pain and rhinorrhea. Negative for ear discharge and sore throat.   Respiratory:  Positive for cough.   Gastrointestinal:  Positive for diarrhea. Negative for abdominal pain and vomiting.  Musculoskeletal:  Negative for neck pain.  Skin:  Negative for rash.  Neurological:  Negative for headaches.        Objective:    BP 118/70   Pulse (!) 137   Temp (!) 103.2 F (39.6 C) (Oral)   Ht 6\' 1"  (1.854 m)   Wt 251 lb (113.9 kg)   SpO2 94%   BMI 33.12 kg/m    Physical Exam Vitals and nursing note reviewed.  Constitutional:      General: He is not in acute distress.    Appearance: He is obese. He is not ill-appearing, toxic-appearing or diaphoretic.  HENT:     Head: Normocephalic and atraumatic.     Right Ear: External ear normal. No drainage, swelling or tenderness. A middle ear effusion is  present. No mastoid tenderness. Tympanic membrane is scarred and erythematous. Tympanic membrane is not bulging.     Left Ear: External ear normal. No drainage, swelling or tenderness. A middle ear effusion is present. No mastoid tenderness. Tympanic membrane is scarred. Tympanic membrane is not erythematous or bulging.     Ears:     Comments: Right TM: effusion with purulent fluid. Edge of titanium rod visualized through TM.   No signs of infection surrounding external portion of BAHA.     Nose: Congestion present.     Mouth/Throat:     Mouth: Mucous membranes are moist.     Pharynx: Oropharynx is clear.  Eyes:     General:        Right eye: No discharge.        Left eye: No discharge.     Conjunctiva/sclera: Conjunctivae normal.  Cardiovascular:     Rate and Rhythm: Regular rhythm. Tachycardia present.     Heart sounds: Normal heart sounds. No murmur heard. Pulmonary:     Effort: Pulmonary effort is normal. No respiratory distress.     Breath sounds: Normal breath sounds. No wheezing or rhonchi.  Musculoskeletal:     Cervical back: Neck supple. No rigidity.  Lymphadenopathy:     Cervical: No cervical adenopathy.  Skin:    General: Skin is warm and dry.  Neurological:  General: No focal deficit present.     Mental Status: He is alert and oriented to person, place, and time.  Psychiatric:        Mood and Affect: Mood normal.        Behavior: Behavior normal.     No results found for any visits on 12/08/22.      Assessment & Plan:   Rhyder was seen today for fever.  Diagnoses and all orders for this visit:  Fever in adult -     Veritor Flu A/B Waived -     Novel Coronavirus, NAA ([facility])  Acute otitis media, unspecified otitis media type -     cefTRIAXone (ROCEPHIN) injection 1 g -     amoxicillin-clavulanate (AUGMENTIN) 875-125 MG tablet; Take 1 tablet by mouth 2 (two) times daily for 10 days.  Status post placement of bone anchored hearing aid  (BAHA)    Negative Flu today in office. Covid testing pending. Rocephin IM today in office. Will have patient return tomorrow for Rocephin IM and recheck with provider. Augmentin sent in to start on 12/10/22. Strict return precautions given. Discussed to notify ENT for new or worsening symptoms. Discussed when to seek emergency care. Discussed tylenol/advil prn for fever. Hydration with fever.   The patient indicates understanding of these issues and agrees with the plan.  Gabriel Earing, FNP

## 2022-12-09 ENCOUNTER — Ambulatory Visit: Payer: BC Managed Care – PPO | Admitting: Family Medicine

## 2022-12-09 ENCOUNTER — Encounter: Payer: Self-pay | Admitting: Family Medicine

## 2022-12-09 VITALS — BP 122/82 | HR 95 | Temp 98.1°F | Ht 73.0 in | Wt 247.0 lb

## 2022-12-09 DIAGNOSIS — Z9621 Cochlear implant status: Secondary | ICD-10-CM

## 2022-12-09 DIAGNOSIS — H6690 Otitis media, unspecified, unspecified ear: Secondary | ICD-10-CM

## 2022-12-09 DIAGNOSIS — R509 Fever, unspecified: Secondary | ICD-10-CM | POA: Diagnosis not present

## 2022-12-09 LAB — NOVEL CORONAVIRUS, NAA: SARS-CoV-2, NAA: NOT DETECTED

## 2022-12-09 MED ORDER — CEFTRIAXONE SODIUM 1 G IJ SOLR
1.0000 g | Freq: Once | INTRAMUSCULAR | Status: AC
  Administered 2022-12-09: 1 g via INTRAMUSCULAR

## 2022-12-09 NOTE — Progress Notes (Signed)
BP 122/82   Pulse 95   Temp 98.1 F (36.7 C)   Ht 6\' 1"  (1.854 m)   Wt 247 lb (112 kg)   SpO2 96%   BMI 32.59 kg/m    Subjective:   Patient ID: [name] [name], male    DOB: 1965/09/02, 57 y.o.   MRN: [id number]  HPI: [name] [name] is a 57 y.o. male presenting on 12/09/2022 for Ear Pain   HPI Patient coming in today for recheck for his right ear. He has a right ear infections and has his cochlear implant and there and had high fevers and tachycardic yesterday and got 1 g Rocephin is coming in today for another gram of Rocephin and for recheck on it.  He says he has not had any fever since yesterday and his heart rate has been back down to a normal heart rate.  Relevant past medical, surgical, family and social history reviewed and updated as indicated. Interim medical history since our last visit reviewed. Allergies and medications reviewed and updated.  Review of Systems  Constitutional:  Positive for fever. Negative for chills.  HENT:  Positive for ear pain. Negative for congestion, ear discharge and facial swelling.   Eyes:  Negative for discharge.  Respiratory:  Negative for shortness of breath and wheezing.   Cardiovascular:  Negative for chest pain and leg swelling.  Musculoskeletal:  Negative for back pain and gait problem.  Skin:  Negative for rash.  All other systems reviewed and are negative.   Per HPI unless specifically indicated above   Allergies as of 12/09/2022       Reactions   Sulfa Antibiotics Rash   Sulfonamide Derivatives Rash   Sertraline Hcl Nausea And Vomiting   Gi upset         Medication List        Accurate as of December 09, 2022  4:16 PM. If you have any questions, ask your nurse or doctor.          acyclovir ointment 5 % Commonly known as: Zovirax Apply 1 application topically every 3 (three) hours.   albuterol 108 (90 Base) MCG/ACT inhaler Commonly known as: VENTOLIN HFA Inhale 2 puffs into the lungs every 6 (six)  hours as needed for wheezing or shortness of breath.   amoxicillin-clavulanate 875-125 MG tablet Commonly known as: AUGMENTIN Take 1 tablet by mouth 2 (two) times daily for 10 days.   atorvastatin 40 MG tablet Commonly known as: LIPITOR Take 1 tablet (40 mg total) by mouth at bedtime.   fluticasone 50 MCG/ACT nasal spray Commonly known as: FLONASE Place 2 sprays into both nostrils daily.   fluticasone furoate-vilanterol 100-25 MCG/ACT Aepb Commonly known as: BREO ELLIPTA Inhale 1 puff into the lungs daily.   ibuprofen 200 MG tablet Commonly known as: ADVIL Take 800-1,200 mg by mouth every 8 (eight) hours as needed (for pain.).   loratadine 10 MG tablet Commonly known as: CLARITIN Take 10 mg by mouth daily.   pantoprazole 40 MG tablet Commonly known as: PROTONIX Take 1 tablet (40 mg total) by mouth at bedtime.         Objective:   BP 122/82   Pulse 95   Temp 98.1 F (36.7 C)   Ht 6\' 1"  (1.854 m)   Wt 247 lb (112 kg)   SpO2 96%   BMI 32.59 kg/m   Wt Readings from Last 3 Encounters:  12/09/22 247 lb (112 kg)  12/08/22 251 lb (113.9 kg)  08/12/22 255 lb (115.7 kg)    Physical Exam Vitals and nursing note reviewed.  Constitutional:      Appearance: Normal appearance.  HENT:     Right Ear: A middle ear effusion is present. Tympanic membrane is injected, erythematous and retracted.  Neurological:     Mental Status: He is alert.       Assessment & Plan:   Problem List Items Addressed This Visit       Other   Status post placement of bone anchored hearing aid (BAHA)   Other Visit Diagnoses     Fever in adult    -  Primary   Relevant Medications   cefTRIAXone (ROCEPHIN) injection 1 g (Completed)   Acute otitis media, unspecified otitis media type       Relevant Medications   cefTRIAXone (ROCEPHIN) injection 1 g (Completed)       Will give 1 g Rocephin and then he can start the Augmentin tomorrow.  He is to keep a close eye on this and call his  ENT doctor and go to the emergency department if it worsens over the weekend or over the holiday. Follow up plan: Return if symptoms worsen or fail to improve.  Counseling provided for all of the vaccine components No orders of the defined types were placed in this encounter.   Arville Care, MD [facility] 12/09/2022, 4:16 PM

## 2023-02-14 ENCOUNTER — Ambulatory Visit: Payer: BC Managed Care – PPO | Admitting: Nurse Practitioner

## 2023-02-16 ENCOUNTER — Ambulatory Visit (INDEPENDENT_AMBULATORY_CARE_PROVIDER_SITE_OTHER): Payer: BC Managed Care – PPO | Admitting: Nurse Practitioner

## 2023-02-16 ENCOUNTER — Encounter: Payer: Self-pay | Admitting: Nurse Practitioner

## 2023-02-16 VITALS — BP 121/85 | HR 70 | Temp 97.8°F | Ht 73.0 in | Wt 248.0 lb

## 2023-02-16 DIAGNOSIS — Z0001 Encounter for general adult medical examination with abnormal findings: Secondary | ICD-10-CM | POA: Diagnosis not present

## 2023-02-16 DIAGNOSIS — E785 Hyperlipidemia, unspecified: Secondary | ICD-10-CM

## 2023-02-16 DIAGNOSIS — F17200 Nicotine dependence, unspecified, uncomplicated: Secondary | ICD-10-CM

## 2023-02-16 DIAGNOSIS — J40 Bronchitis, not specified as acute or chronic: Secondary | ICD-10-CM

## 2023-02-16 DIAGNOSIS — Z0000 Encounter for general adult medical examination without abnormal findings: Secondary | ICD-10-CM

## 2023-02-16 DIAGNOSIS — K219 Gastro-esophageal reflux disease without esophagitis: Secondary | ICD-10-CM

## 2023-02-16 MED ORDER — FLUTICASONE FUROATE-VILANTEROL 100-25 MCG/ACT IN AEPB: 1.0000 | INHALATION_SPRAY | Freq: Every day | RESPIRATORY_TRACT | 11 refills | Status: DC

## 2023-02-16 MED ORDER — ATORVASTATIN CALCIUM 40 MG PO TABS: 40.0000 mg | ORAL_TABLET | Freq: Every day | ORAL | 1 refills | Status: DC

## 2023-02-16 MED ORDER — PANTOPRAZOLE SODIUM 40 MG PO TBEC: 40.0000 mg | DELAYED_RELEASE_TABLET | Freq: Every day | ORAL | 1 refills | Status: DC

## 2023-02-16 NOTE — Patient Instructions (Signed)

## 2023-02-16 NOTE — Progress Notes (Signed)
Subjective:    Patient ID: [name] [name], male    DOB: [birth year], 58 y.o.   MRN: [id number]   Chief Complaint: annual physical exam    HPI:  [name] [name] is a 58 y.o. who identifies as a male who was assigned male at birth.   Social history: Lives with: wife and kids Work history: works in [employer].   Comes in today for follow up of the following chronic medical issues:  1. Hyperlipidemia with target LDL less than 100 Does not really watch diet and does no dedicated exercise. Lab Results  Component Value Date   CHOL 172 08/12/2022   HDL 31 (L) 08/12/2022   LDLCALC 126 (H) 08/12/2022   TRIG 81 08/12/2022   CHOLHDL 5.5 (H) 08/12/2022   The 10-year ASCVD risk score (Arnett DK, et al., 2019) is: 6.5%   2. Gastroesophageal reflux disease without esophagitis Is on protonix daily and is doing well.  3. snuff Patient quit  4. BMI 33.0-33.9,adult No recent weight changes  Wt Readings from Last 3 Encounters:  02/16/23 248 lb (112.5 kg)  12/09/22 247 lb (112 kg)  12/08/22 251 lb (113.9 kg)   BMI Readings from Last 3 Encounters:  02/16/23 32.72 kg/m  12/09/22 32.59 kg/m  12/08/22 33.12 kg/m      New complaints: None today  Allergies  Allergen Reactions   Sulfa Antibiotics Rash   Sulfonamide Derivatives Rash   Sertraline Hcl Nausea And Vomiting    Gi upset    Outpatient Encounter Medications as of 02/16/2023  Medication Sig   acyclovir ointment (ZOVIRAX) 5 % Apply 1 application topically every 3 (three) hours.   albuterol (VENTOLIN HFA) 108 (90 Base) MCG/ACT inhaler Inhale 2 puffs into the lungs every 6 (six) hours as needed for wheezing or shortness of breath.   atorvastatin (LIPITOR) 40 MG tablet Take 1 tablet (40 mg total) by mouth at bedtime.   fluticasone (FLONASE) 50 MCG/ACT nasal spray Place 2 sprays into both nostrils daily.   fluticasone furoate-vilanterol (BREO ELLIPTA) 100-25 MCG/ACT AEPB Inhale 1 puff into the lungs daily.    ibuprofen (ADVIL) 200 MG tablet Take 800-1,200 mg by mouth every 8 (eight) hours as needed (for pain.).   loratadine (CLARITIN) 10 MG tablet Take 10 mg by mouth daily.   pantoprazole (PROTONIX) 40 MG tablet Take 1 tablet (40 mg total) by mouth at bedtime.   No facility-administered encounter medications on file as of 02/16/2023.    Past Surgical History:  Procedure Laterality Date   CHOLESTEATOMA EXCISION  1994   COLONOSCOPY N/A 11/09/2018   Procedure: COLONOSCOPY;  Surgeon: Corbin Ade, MD;  Location: [facility];  Service: Endoscopy;  Laterality: N/A;  12:00   Left ear surgery     Right ear surgery     right knee arthroscopy      Family History  Problem Relation Age of Onset   Colon cancer Neg Hx       Controlled substance contract: n/a     Review of Systems  Constitutional:  Negative for diaphoresis.  Eyes:  Negative for pain.  Respiratory:  Negative for shortness of breath.   Cardiovascular:  Negative for chest pain, palpitations and leg swelling.  Gastrointestinal:  Negative for abdominal pain.  Endocrine: Negative for polydipsia.  Skin:  Negative for rash.  Neurological:  Negative for dizziness, weakness and headaches.  Hematological:  Does not bruise/bleed easily.  All other systems reviewed and are negative.  Objective:   Physical Exam Vitals and nursing note reviewed.  Constitutional:      Appearance: Normal appearance. He is well-developed.  HENT:     Head: Normocephalic.     Right Ear: Tympanic membrane is scarred and perforated.     Left Ear: Tympanic membrane is scarred.     Ears:     Comments: Right cochlear implant    Nose: Nose normal.     Mouth/Throat:     Mouth: Mucous membranes are moist.     Pharynx: Oropharynx is clear.     Comments: Multiple teeth missing Eyes:     Pupils: Pupils are equal, round, and reactive to light.  Neck:     Thyroid: No thyroid mass or thyromegaly.     Vascular: No carotid bruit or JVD.     Trachea:  Phonation normal.  Cardiovascular:     Rate and Rhythm: Normal rate and regular rhythm.  Pulmonary:     Effort: Pulmonary effort is normal. No respiratory distress.     Breath sounds: Normal breath sounds.  Abdominal:     General: Bowel sounds are normal.     Palpations: Abdomen is soft.     Tenderness: There is no abdominal tenderness.  Musculoskeletal:        General: Normal range of motion.     Cervical back: Normal range of motion and neck supple.  Lymphadenopathy:     Cervical: No cervical adenopathy.  Skin:    General: Skin is warm and dry.  Neurological:     Mental Status: He is alert and oriented to person, place, and time.  Psychiatric:        Behavior: Behavior normal.        Thought Content: Thought content normal.        Judgment: Judgment normal.     BP 121/85   Pulse 70   Temp 97.8 F (36.6 C) (Temporal)   Ht 6\' 1"  (1.854 m)   Wt 248 lb (112.5 kg)   SpO2 98%   BMI 32.72 kg/m         Assessment & Plan:   [name] in today with chief complaint of Annual Exam   1. Annual physical exam (Primary)   2. Hyperlipidemia with target LDL less than 100 Low sodium diet - atorvastatin (LIPITOR) 40 MG tablet; Take 1 tablet (40 mg total) by mouth at bedtime.  Dispense: 90 tablet; Refill: 1 - CBC with Differential/Platelet - CMP14+EGFR - Lipid panel  3. Gastroesophageal reflux disease without esophagitis Avoid spicy foods Do not eat 2 hours prior to bedtime  - pantoprazole (PROTONIX) 40 MG tablet; Take 1 tablet (40 mg total) by mouth at bedtime.  Dispense: 90 tablet; Refill: 1  4. snuff Do not start back  5. Bronchitis Continue BREO as prescribed - fluticasone furoate-vilanterol (BREO ELLIPTA) 100-25 MCG/ACT AEPB; Inhale 1 puff into the lungs daily.  Dispense: 1 each; Refill: 11    The above assessment and management plan was discussed with the patient. The patient verbalized understanding of and has agreed to the management plan. Patient is  aware to call the clinic if symptoms persist or worsen. Patient is aware when to return to the clinic for a follow-up visit. Patient educated on when it is appropriate to go to the emergency department.   [name] Daphine Deutscher, FNP

## 2023-02-17 LAB — CMP14+EGFR
ALT: 15 [IU]/L (ref 0–44)
AST: 15 [IU]/L (ref 0–40)
Albumin: 3.9 g/dL (ref 3.8–4.9)
Alkaline Phosphatase: 153 [IU]/L — ABNORMAL HIGH (ref 44–121)
BUN/Creatinine Ratio: 14 (ref 9–20)
BUN: 12 mg/dL (ref 6–24)
Bilirubin Total: 0.5 mg/dL (ref 0.0–1.2)
CO2: 22 mmol/L (ref 20–29)
Calcium: 9.2 mg/dL (ref 8.7–10.2)
Chloride: 104 mmol/L (ref 96–106)
Creatinine, Ser: 0.85 mg/dL (ref 0.76–1.27)
Globulin, Total: 2.6 g/dL (ref 1.5–4.5)
Glucose: 106 mg/dL — ABNORMAL HIGH (ref 70–99)
Potassium: 4.2 mmol/L (ref 3.5–5.2)
Sodium: 142 mmol/L (ref 134–144)
Total Protein: 6.5 g/dL (ref 6.0–8.5)
eGFR: 103 mL/min/{1.73_m2} (ref >59)

## 2023-02-17 LAB — CBC WITH DIFFERENTIAL/PLATELET
Basophils Absolute: 0.1 10*3/uL (ref 0.0–0.2)
Basos: 1 %
EOS (ABSOLUTE): 0.3 10*3/uL (ref 0.0–0.4)
Eos: 3 %
Hematocrit: 49.6 % (ref 37.5–51.0)
Hemoglobin: 16.4 g/dL (ref 13.0–17.7)
Immature Grans (Abs): 0.1 10*3/uL (ref 0.0–0.1)
Immature Granulocytes: 1 %
Lymphocytes Absolute: 3.1 10*3/uL (ref 0.7–3.1)
Lymphs: 27 %
MCH: 33.1 pg — ABNORMAL HIGH (ref 26.6–33.0)
MCHC: 33.1 g/dL (ref 31.5–35.7)
MCV: 100 fL — ABNORMAL HIGH (ref 79–97)
Monocytes Absolute: 0.8 10*3/uL (ref 0.1–0.9)
Monocytes: 7 %
Neutrophils Absolute: 7.1 10*3/uL — ABNORMAL HIGH (ref 1.4–7.0)
Neutrophils: 61 %
Platelets: 248 10*3/uL (ref 150–450)
RBC: 4.95 x10E6/uL (ref 4.14–5.80)
RDW: 13.5 % (ref 11.6–15.4)
WBC: 11.5 10*3/uL — ABNORMAL HIGH (ref 3.4–10.8)

## 2023-02-17 LAB — LIPID PANEL
Chol/HDL Ratio: 5.6 {ratio} — ABNORMAL HIGH (ref 0.0–5.0)
Cholesterol, Total: 173 mg/dL (ref 100–199)
HDL: 31 mg/dL — ABNORMAL LOW (ref >39)
LDL Chol Calc (NIH): 124 mg/dL — ABNORMAL HIGH (ref 0–99)
Triglycerides: 97 mg/dL (ref 0–149)
VLDL Cholesterol Cal: 18 mg/dL (ref 5–40)

## 2023-04-25 ENCOUNTER — Other Ambulatory Visit: Payer: Self-pay | Admitting: Family Medicine

## 2023-04-25 DIAGNOSIS — H6690 Otitis media, unspecified, unspecified ear: Secondary | ICD-10-CM

## 2023-04-28 ENCOUNTER — Encounter: Payer: Self-pay | Admitting: Nurse Practitioner

## 2023-04-28 ENCOUNTER — Ambulatory Visit: Admitting: Nurse Practitioner

## 2023-04-28 ENCOUNTER — Other Ambulatory Visit: Payer: Self-pay

## 2023-04-28 VITALS — BP 135/82 | HR 79 | Temp 97.8°F | Ht 73.0 in | Wt 244.0 lb

## 2023-04-28 DIAGNOSIS — J069 Acute upper respiratory infection, unspecified: Secondary | ICD-10-CM

## 2023-04-28 DIAGNOSIS — L03811 Cellulitis of head [any part, except face]: Secondary | ICD-10-CM

## 2023-04-28 DIAGNOSIS — J0101 Acute recurrent maxillary sinusitis: Secondary | ICD-10-CM | POA: Diagnosis not present

## 2023-04-28 MED ORDER — FLUTICASONE PROPIONATE 50 MCG/ACT NA SUSP: 2.0000 | Freq: Every day | NASAL | 6 refills | Status: DC

## 2023-04-28 MED ORDER — AMOXICILLIN-POT CLAVULANATE 875-125 MG PO TABS: 1.0000 | ORAL_TABLET | Freq: Two times a day (BID) | ORAL | 0 refills | Status: DC

## 2023-04-28 MED ORDER — CEFTRIAXONE SODIUM 1 G IJ SOLR
1.0000 g | Freq: Once | INTRAMUSCULAR | Status: AC
  Administered 2023-04-28: 1 g via INTRAMUSCULAR

## 2023-04-28 MED ORDER — FLUTICASONE PROPIONATE 50 MCG/ACT NA SUSP: 2.0000 | Freq: Every day | NASAL | 6 refills | Status: AC

## 2023-04-28 NOTE — Addendum Note (Signed)
 Addended by: Bennie Pierini on: 04/28/2023 03:50 PM   Modules accepted: Orders

## 2023-04-28 NOTE — Progress Notes (Signed)
 Subjective:    Patient ID: [name] [name], male    DOB: [birth year], 58 y.o.   MRN: [id number]   Chief Complaint: Sinus Problem, Ear Pain (Right ear/), and Nasal Congestion   Sinus Problem This is a new problem. The current episode started in the past 7 days. The problem has been waxing and waning since onset. There has been no fever. His pain is at a severity of 9/10. The pain is severe. Associated symptoms include congestion, coughing, ear pain and sinus pressure. Pertinent negatives include no chills. Past treatments include acetaminophen. The treatment provided mild relief.    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  HENT:  Positive for congestion, ear pain and sinus pressure. Negative for ear discharge.   Respiratory:  Positive for cough.        Objective:   Physical Exam Constitutional:      Appearance: Normal appearance. He is obese.  HENT:     Right Ear: A middle ear effusion is present. Tympanic membrane is bulging.     Left Ear: A middle ear effusion is present.     Ears:     Comments: Cochlear implant site erythematous    Nose:     Right Sinus: Maxillary sinus tenderness present.     Left Sinus: Maxillary sinus tenderness present.  Cardiovascular:     Rate and Rhythm: Normal rate and regular rhythm.     Heart sounds: Normal heart sounds.  Pulmonary:     Effort: Pulmonary effort is normal.     Breath sounds: Normal breath sounds.  Neurological:     Mental Status: He is alert.    BP 135/82   Pulse 79   Temp 97.8 F (36.6 C) (Temporal)   Ht 6\' 1"  (1.854 m)   Wt 244 lb  (110.7 kg)   BMI 32.19 kg/m         Assessment & Plan:   [name] [name] in today with chief complaint of Sinus Problem, Ear Pain (Right ear/), and Nasal Congestion   1. Acute recurrent maxillary sinusitis (Primary) 1. Take meds as prescribed 2. Use a cool mist humidifier especially during the winter months and when heat has been humid. 3. Use saline nose sprays frequently 4. Saline irrigations of the nose can be very helpful if done frequently.  * 4X daily for 1 week*  * Use of a nettie pot can be helpful with this. Follow directions with this* 5. Drink plenty of fluids 6. Keep thermostat turn down low 7.For any cough or congestion- mucinex 8. For fever or aces or pains- take tylenol or ibuprofen appropriate for age and weight.  * for fevers greater than 101 orally you may alternate ibuprofen and tylenol every  3 hours.    - amoxicillin-clavulanate (AUGMENTIN) 875-125 MG tablet; Take 1 tablet by mouth 2 (two) times daily.  Dispense: 20 tablet; Refill: 0  2. Cellulitis of head or scalp - cefTRIAXone (ROCEPHIN) injection 1 g    The above assessment and management plan was discussed with the patient. The patient verbalized understanding of and has agreed to  the management plan. Patient is aware to call the clinic if symptoms persist or worsen. Patient is aware when to return to the clinic for a follow-up visit. Patient educated on when it is appropriate to go to the emergency department.   [name], FNP

## 2023-04-28 NOTE — Patient Instructions (Signed)

## 2023-05-12 ENCOUNTER — Ambulatory Visit: Admitting: Nurse Practitioner

## 2023-05-12 ENCOUNTER — Encounter: Payer: Self-pay | Admitting: Nurse Practitioner

## 2023-05-12 VITALS — BP 115/78 | HR 71 | Temp 97.6°F | Ht 73.0 in | Wt 238.0 lb

## 2023-05-12 DIAGNOSIS — J069 Acute upper respiratory infection, unspecified: Secondary | ICD-10-CM

## 2023-05-12 MED ORDER — PREDNISONE 20 MG PO TABS: ORAL_TABLET | ORAL | 0 refills | Status: DC

## 2023-05-12 MED ORDER — HYDROCODONE BIT-HOMATROP MBR 5-1.5 MG/5ML PO SOLN: 5.0000 mL | Freq: Four times a day (QID) | ORAL | 0 refills | Status: DC | PRN

## 2023-05-12 MED ORDER — METHYLPREDNISOLONE ACETATE 80 MG/ML IJ SUSP
80.0000 mg | Freq: Once | INTRAMUSCULAR | Status: AC
  Administered 2023-05-12: 80 mg via INTRAMUSCULAR

## 2023-05-12 NOTE — Patient Instructions (Signed)
1. Take meds as prescribed 2. Use a cool mist humidifier especially during the winter months and when heat has been humid. 3. Use saline nose sprays frequently 4. Saline irrigations of the nose can be very helpful if done frequently.  * 4X daily for 1 week*  * Use of a nettie pot can be helpful with this. Follow directions with this* 5. Drink plenty of fluids 6. Keep thermostat turn down low 7.For any cough or congestion- hycodan with sedation precautions 8. For fever or aces or pains- take tylenol or ibuprofen appropriate for age and weight.  * for fevers greater than 101 orally you may alternate ibuprofen and tylenol every  3 hours.    

## 2023-05-12 NOTE — Progress Notes (Signed)
 Subjective:    Patient ID: KIM LAUVER, male    DOB: [birth year], 58 y.o.   MRN: [id number]   Chief Complaint: sinusitis  Patient ome sin again with sinus pressure that did not resolve with rocephin  and augmentin   Sinusitis This is a recurrent problem. The current episode started 1 to 4 weeks ago. The problem has been waxing and waning since onset. There has been no fever. The fever has been present for 1 to 2 days. His pain is at a severity of 8/10. The pain is mild. Associated symptoms include congestion, coughing and sinus pressure. Pertinent negatives include no chills. Past treatments include nasal decongestants. The treatment provided mild relief.    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  HENT:  Positive for congestion and sinus pressure.   Respiratory:  Positive for cough.        Objective:   Physical Exam Constitutional:      Appearance: Normal appearance. He is obese.  HENT:     Right Ear: Tympanic membrane normal.     Left Ear: Tympanic membrane normal.     Ears:     Comments: He has lots of scarring on both ear drums    Mouth/Throat:     Mouth: Mucous membranes are moist.     Pharynx: No oropharyngeal exudate or posterior oropharyngeal erythema.  Cardiovascular:     Rate and Rhythm: Normal rate and regular rhythm.     Heart sounds: Normal heart sounds.  Pulmonary:     Effort: Pulmonary effort is normal.     Breath sounds: Normal breath sounds.   Skin:    General: Skin is warm.  Neurological:     General: No focal deficit present.     Mental Status: He is alert and oriented to person, place, and time.  Psychiatric:        Mood and Affect: Mood normal.        Behavior: Behavior normal.    BP 115/78   Pulse 71   Temp 97.6 F (36.4 C) (Temporal)   Ht 6\' 1"  (1.854 m)   Wt 238 lb (108 kg)   SpO2 94%   BMI 31.40 kg/m         Assessment & Plan:  Marisue Side in today with chief complaint of Sinus Problem (Finished all antibiotics and now is worse/Wants cough meds too/) and Losing weight   1. URI with cough and congestion (Primary) 1. Take meds as prescribed 2. Use a cool mist humidifier especially during the winter months and when heat has been humid. 3. Use saline nose sprays frequently 4. Saline irrigations of the nose can be very helpful if done frequently.  * 4X daily for 1 week*  * Use of a nettie pot can be helpful with this. Follow directions with this* 5. Drink plenty of fluids 6. Keep thermostat turn down low 7.For any cough or congestion- hycodan with sedation precautions 8.  For fever or aces or pains- take tylenol or ibuprofen appropriate for age and weight.  * for fevers greater than 101 orally you may alternate ibuprofen and tylenol every  3 hours.    - predniSONE  (DELTASONE ) 20 MG tablet; 2 po at sametime daily for 5 days-  Dispense: 10 tablet; Refill: 0 - methylPREDNISolone  acetate (DEPO-MEDROL ) injection 80 mg - HYDROcodone  bit-homatropine (HYCODAN) 5-1.5 MG/5ML syrup; Take 5 mLs by mouth every 6 (six) hours as needed for cough.  Dispense: 120 mL; Refill: 0    The above assessment and management plan was discussed with the patient. The patient verbalized understanding of and has agreed to the management plan. Patient is aware to call the clinic if symptoms persist or worsen. Patient is aware when to return to the clinic for a follow-up visit. Patient educated on when it is appropriate to go to  the emergency department.   Mary-Margaret Gaylyn Keas, FNP

## 2023-08-17 NOTE — Progress Notes (Unsigned)
 Subjective:    Patient ID: [name] [name], male    DOB: 1965-06-26, 58 y.o.   MRN: [id number]   Chief Complaint: medical management of chronic issues     HPI:  [name] [name] is a 58 y.o. who identifies as a male who was assigned male at birth.   Social history: Lives with: wife and kids Work history: works in [employer] in today for follow up of the following chronic medical issues:  1. Hyperlipidemia with target LDL less than 100 Does not watch diet and does no dedicated exercise. Lab Results  Component Value Date   CHOL 173 02/16/2023   HDL 31 (L) 02/16/2023   LDLCALC 124 (H) 02/16/2023   TRIG 97 02/16/2023   CHOLHDL 5.6 (H) 02/16/2023   The 10-year ASCVD risk score (Arnett DK, et al., 2019) is: 6%   2. Gastroesophageal reflux disease without esophagitis Is on protonix  daily and is doing well.  3. snuff Quit in August of last year  4. BMI 33.0-33.9,adult Weight down 3 lbs  Wt Readings from Last 3 Encounters:  08/18/23 240 lb (108.9 kg)  05/12/23 238 lb (108 kg)  04/28/23 244 lb (110.7 kg)   BMI Readings from Last 3 Encounters:  08/18/23 31.66 kg/m  05/12/23 31.40 kg/m  04/28/23 32.19 kg/m   5. COPD Is doing well. He is currently on BREO but is to expensive and would like to change to something less expensive.  New complaints: Cochlear implant is draning and sore to the touch  Allergies  Allergen Reactions   Sulfa Antibiotics Rash   Sulfonamide Derivatives Rash   Sertraline Hcl Nausea And Vomiting    Gi upset    Outpatient Encounter Medications as of 08/18/2023  Medication Sig   albuterol  (VENTOLIN  HFA) 108 (90 Base) MCG/ACT inhaler Inhale 2 puffs into the lungs every 6 (six) hours as needed for wheezing or shortness of breath.   atorvastatin  (LIPITOR) 40 MG tablet Take 1 tablet (40 mg total) by mouth at bedtime.   fluticasone  (FLONASE ) 50 MCG/ACT nasal spray Place 2 sprays into both nostrils daily.   fluticasone   furoate-vilanterol (BREO ELLIPTA ) 100-25 MCG/ACT AEPB Inhale 1 puff into the lungs daily.   HYDROcodone  bit-homatropine (HYCODAN) 5-1.5 MG/5ML syrup Take 5 mLs by mouth every 6 (six) hours as needed for cough.   ibuprofen (ADVIL) 200 MG tablet Take 800-1,200 mg by mouth every 8 (eight) hours as needed (for pain.).   loratadine  (CLARITIN ) 10 MG tablet Take 10 mg by mouth daily.   pantoprazole  (PROTONIX ) 40 MG tablet Take 1 tablet (40 mg total) by mouth at bedtime.   predniSONE  (DELTASONE ) 20 MG tablet 2 po at sametime daily for 5 days-   No facility-administered encounter medications on file as of 08/18/2023.    Past Surgical History:  Procedure Laterality Date   CHOLESTEATOMA EXCISION  1994   COLONOSCOPY N/A 11/09/2018   Procedure: COLONOSCOPY;  Surgeon: Shaaron Lamar HERO, MD;  Location: [facility];  Service: Endoscopy;  Laterality: N/A;  12:00   Left ear surgery     Right ear surgery     right knee arthroscopy      Family History  Problem Relation Age of Onset   Colon cancer Neg Hx       Controlled substance contract: n/a     Review of Systems  Constitutional:  Negative for diaphoresis.  Eyes:  Negative for pain.  Respiratory:  Negative for shortness of breath.   Cardiovascular:  Negative for chest pain, palpitations and leg swelling.  Gastrointestinal:  Negative for abdominal pain.  Endocrine: Negative for polydipsia.  Skin:  Negative for rash.  Neurological:  Negative for dizziness, weakness and headaches.  Hematological:  Does not bruise/bleed easily.  All other systems reviewed and are negative.      Objective:   Physical Exam Vitals and nursing note reviewed.  Constitutional:      Appearance: Normal appearance. He is well-developed.  HENT:     Head: Normocephalic.     Nose: Nose normal.     Mouth/Throat:     Mouth: Mucous membranes are moist.     Pharynx: Oropharynx is clear.  Eyes:     Pupils: Pupils are equal, round, and reactive to light.  Neck:      Thyroid : No thyroid  mass or thyromegaly.     Vascular: No carotid bruit or JVD.     Trachea: Phonation normal.  Cardiovascular:     Rate and Rhythm: Normal rate and regular rhythm.  Pulmonary:     Effort: Pulmonary effort is normal. No respiratory distress.     Breath sounds: Normal breath sounds.  Abdominal:     General: Bowel sounds are normal.     Palpations: Abdomen is soft.     Tenderness: There is no abdominal tenderness.  Musculoskeletal:        General: Normal range of motion.     Cervical back: Normal range of motion and neck supple.  Lymphadenopathy:     Cervical: No cervical adenopathy.  Skin:    General: Skin is warm and dry.  Neurological:     Mental Status: He is alert and oriented to person, place, and time.  Psychiatric:        Behavior: Behavior normal.        Thought Content: Thought content normal.        Judgment: Judgment normal.     BP 130/87   Pulse 67   Temp (!) 97.5 F (36.4 C) (Temporal)   Ht 6' 1 (1.854 m)   Wt 240 lb (108.9 kg)   SpO2 96%   BMI 31.66 kg/m         Assessment & Plan:  [name] [name] comes in today with chief complaint of Medical Management of Chronic Issues   Diagnosis and orders addressed:  1. Hyperlipidemia with target LDL less than 100 (Primary) Low fat diet - PSA, total and free - CBC with Differential/Platelet - CMP14+EGFR - Lipid panel - atorvastatin  (LIPITOR) 40 MG tablet; Take 1 tablet (40 mg total) by mouth at bedtime.  Dispense: 90 tablet; Refill: 1  2. Gastroesophageal reflux disease without esophagitis Avoid spicy foods Do not eat 2 hours prior to bedtime - pantoprazole  (PROTONIX ) 40 MG tablet; Take 1 tablet (40 mg total) by mouth at bedtime.  Dispense: 90 tablet; Refill: 1  3. BMI 33.0-33.9,adult Discussed diet and exercise for person with BMI >25 Will recheck weight in 3-6 months   4. History of snuff use Do not start using snuff pain  5. Simple chronic bronchitis (HCC) Change from BREO  to symbicort  - budesonide -formoterol  (SYMBICORT ) 80-4.5 MCG/ACT inhaler; Inhale 2 puffs into the lungs 2 (two) times daily.  Dispense: 1 each; Refill: 3  6. BAHA cochlear implant bone infection (HCC) Moist heat rest - amoxicillin -clavulanate (AUGMENTIN ) 875-125 MG tablet; Take 1 tablet by mouth 2 (two) times daily.  Dispense: 20 tablet; Refill: 0   Labs pending Health Maintenance reviewed Diet and exercise encouraged  Follow up plan: 6 months   Mary-Margaret Gladis, FNP

## 2023-08-18 ENCOUNTER — Ambulatory Visit: Payer: BC Managed Care – PPO | Admitting: Nurse Practitioner

## 2023-08-18 ENCOUNTER — Encounter: Payer: Self-pay | Admitting: Nurse Practitioner

## 2023-08-18 VITALS — BP 130/87 | HR 67 | Temp 97.5°F | Ht 73.0 in | Wt 240.0 lb

## 2023-08-18 DIAGNOSIS — Z87891 Personal history of nicotine dependence: Secondary | ICD-10-CM

## 2023-08-18 DIAGNOSIS — T8579XA Infection and inflammatory reaction due to other internal prosthetic devices, implants and grafts, initial encounter: Secondary | ICD-10-CM

## 2023-08-18 DIAGNOSIS — K219 Gastro-esophageal reflux disease without esophagitis: Secondary | ICD-10-CM | POA: Diagnosis not present

## 2023-08-18 DIAGNOSIS — Z6833 Body mass index (BMI) 33.0-33.9, adult: Secondary | ICD-10-CM

## 2023-08-18 DIAGNOSIS — J410 Simple chronic bronchitis: Secondary | ICD-10-CM

## 2023-08-18 DIAGNOSIS — E785 Hyperlipidemia, unspecified: Secondary | ICD-10-CM | POA: Diagnosis not present

## 2023-08-18 LAB — LIPID PANEL

## 2023-08-18 MED ORDER — BUDESONIDE-FORMOTEROL FUMARATE 80-4.5 MCG/ACT IN AERO: 2.0000 | INHALATION_SPRAY | Freq: Two times a day (BID) | RESPIRATORY_TRACT | 3 refills | Status: DC

## 2023-08-18 MED ORDER — AMOXICILLIN-POT CLAVULANATE 875-125 MG PO TABS: 1.0000 | ORAL_TABLET | Freq: Two times a day (BID) | ORAL | 0 refills | Status: DC

## 2023-08-18 MED ORDER — PANTOPRAZOLE SODIUM 40 MG PO TBEC: 40.0000 mg | DELAYED_RELEASE_TABLET | Freq: Every day | ORAL | 1 refills | Status: DC

## 2023-08-18 MED ORDER — ATORVASTATIN CALCIUM 40 MG PO TABS: 40.0000 mg | ORAL_TABLET | Freq: Every day | ORAL | 1 refills | Status: DC

## 2023-08-18 NOTE — Patient Instructions (Signed)
 COPD and Physical Activity Chronic obstructive pulmonary disease (COPD) is a long-term, or chronic, condition that affects the lungs. COPD is a general term that can be used to describe many problems that cause inflammation of the lungs and limit airflow. These conditions include chronic bronchitis and emphysema. The main symptom of COPD is shortness of breath, which makes it harder to do even simple tasks. This can also make it harder to exercise and stay active. Talk with your health care provider about treatments to help you breathe better and actions you can take to prevent breathing problems during physical activity. What are the benefits of exercising when you have COPD? Exercising regularly is an important part of a healthy lifestyle. You can still exercise and do physical activities even though you have COPD. Exercise and physical activity improve your shortness of breath by increasing blood flow (circulation). This causes your heart to pump more oxygen through your body. Moderate exercise can: Improve oxygen use. Increase your energy level. Help with shortness of breath. Strengthen your breathing muscles. Improve heart health. Help with sleep. Improve your self-esteem and feelings of self-worth. Lower depression, stress, and anxiety. Exercise can benefit everyone with COPD. The severity of your disease may affect how hard you can exercise, especially at first, but everyone can benefit. Talk with your health care provider about how much exercise is safe for you, and which activities and exercises are safe for you. What actions can I take to prevent breathing problems during physical activity? Sign up for a pulmonary rehabilitation program. This type of program may include: Education about lung diseases. Exercise classes that teach you how to exercise and be more active while improving your breathing. This usually involves: Exercise using your lower extremities, such as a stationary  bicycle. About 30 minutes of exercise, 2 to 5 times per week, for 6 to 12 weeks. Strength training, such as push-ups or leg lifts. Nutrition education. Group classes in which you can talk with others who also have COPD and learn ways to manage stress. If you use an oxygen tank, you should use it while you exercise. Work with your health care provider to adjust your oxygen for your physical activity. Your resting flow rate is different from your flow rate during physical activity. How to manage your breathing while exercising While you are exercising: Take slow breaths. Pace yourself, and do nottry to go too fast. Purse your lips while breathing out. Pursing your lips is similar to a kissing or whistling position. If doing exercise that uses a quick burst of effort, such as weight lifting: Breathe in before starting the exercise. Breathe out during the hardest part of the exercise, such as raising the weights. Where to find support You can find support for exercising with COPD from: Your health care provider. A pulmonary rehabilitation program. Your local health department or community health programs. Support groups, either online or in-person. Your health care provider may be able to recommend support groups. Where to find more information You can find more information about exercising with COPD from: American Lung Association: lung.org COPD Foundation: copdfoundation.org Contact a health care provider if: Your symptoms get worse. You have nausea. You have a fever. You want to start a new exercise program or a new activity. Get help right away if: You have chest pain. You cannot breathe. These symptoms may represent a serious problem that is an emergency. Do not wait to see if the symptoms will go away. Get medical help right away. Call  your local emergency services (911 in the [place] Do not drive yourself to the hospital. Summary COPD is a general term that can be used to describe  many different lung problems that cause lung inflammation and limit airflow. This includes chronic bronchitis and emphysema. Exercise and physical activity improve your shortness of breath by increasing blood flow (circulation). This causes your heart to provide more oxygen to your body. Contact your health care provider before starting any exercise program or new activity. Ask your health care provider what exercises and activities are safe for you. This information is not intended to replace advice given to you by your health care provider. Make sure you discuss any questions you have with your health care provider. Document Revised: 11/18/2022 Document Reviewed: 11/18/2022 Elsevier Patient Education  2024 [employer].

## 2023-08-19 LAB — LIPID PANEL
Chol/HDL Ratio: 4.8 ratio (ref 0.0–5.0)
Cholesterol, Total: 163 mg/dL (ref 100–199)
HDL: 34 mg/dL — ABNORMAL LOW (ref >39)
LDL Chol Calc (NIH): 116 mg/dL — ABNORMAL HIGH (ref 0–99)
Triglycerides: 65 mg/dL (ref 0–149)
VLDL Cholesterol Cal: 13 mg/dL (ref 5–40)

## 2023-08-19 LAB — PSA, TOTAL AND FREE
PSA, Free Pct: 20 %
PSA, Free: 0.16 ng/mL
Prostate Specific Ag, Serum: 0.8 ng/mL (ref 0.0–4.0)

## 2023-08-19 LAB — CBC WITH DIFFERENTIAL/PLATELET
Basophils Absolute: 0.1 x10E3/uL (ref 0.0–0.2)
Basos: 1 %
EOS (ABSOLUTE): 0.2 x10E3/uL (ref 0.0–0.4)
Eos: 2 %
Hematocrit: 50.3 % (ref 37.5–51.0)
Hemoglobin: 16.7 g/dL (ref 13.0–17.7)
Immature Grans (Abs): 0.1 x10E3/uL (ref 0.0–0.1)
Immature Granulocytes: 1 %
Lymphocytes Absolute: 3.4 x10E3/uL — ABNORMAL HIGH (ref 0.7–3.1)
Lymphs: 33 %
MCH: 34.5 pg — ABNORMAL HIGH (ref 26.6–33.0)
MCHC: 33.2 g/dL (ref 31.5–35.7)
MCV: 104 fL — ABNORMAL HIGH (ref 79–97)
Monocytes Absolute: 0.8 x10E3/uL (ref 0.1–0.9)
Monocytes: 8 %
Neutrophils Absolute: 5.8 x10E3/uL (ref 1.4–7.0)
Neutrophils: 55 %
Platelets: 255 x10E3/uL (ref 150–450)
RBC: 4.84 x10E6/uL (ref 4.14–5.80)
RDW: 13.1 % (ref 11.6–15.4)
WBC: 10.4 x10E3/uL (ref 3.4–10.8)

## 2023-08-19 LAB — CMP14+EGFR
ALT: 17 IU/L (ref 0–44)
AST: 14 IU/L (ref 0–40)
Albumin: 4.2 g/dL (ref 3.8–4.9)
Alkaline Phosphatase: 167 IU/L — ABNORMAL HIGH (ref 44–121)
BUN/Creatinine Ratio: 18 (ref 9–20)
BUN: 15 mg/dL (ref 6–24)
Bilirubin Total: 0.4 mg/dL (ref 0.0–1.2)
CO2: 23 mmol/L (ref 20–29)
Calcium: 9.3 mg/dL (ref 8.7–10.2)
Chloride: 101 mmol/L (ref 96–106)
Creatinine, Ser: 0.85 mg/dL (ref 0.76–1.27)
Globulin, Total: 2.9 g/dL (ref 1.5–4.5)
Glucose: 98 mg/dL (ref 70–99)
Potassium: 4.8 mmol/L (ref 3.5–5.2)
Sodium: 139 mmol/L (ref 134–144)
Total Protein: 7.1 g/dL (ref 6.0–8.5)
eGFR: 103 mL/min/1.73 (ref >59)

## 2023-08-21 ENCOUNTER — Ambulatory Visit: Payer: Self-pay | Admitting: Nurse Practitioner

## 2023-09-28 MED ORDER — CIPROFLOXACIN HCL 500 MG PO TABS: 500.0000 mg | ORAL_TABLET | Freq: Two times a day (BID) | ORAL | 0 refills | Status: DC

## 2023-10-05 DIAGNOSIS — T8579XA Infection and inflammatory reaction due to other internal prosthetic devices, implants and grafts, initial encounter: Secondary | ICD-10-CM | POA: Diagnosis not present

## 2023-10-05 DIAGNOSIS — Z9621 Cochlear implant status: Secondary | ICD-10-CM | POA: Diagnosis not present

## 2024-02-16 ENCOUNTER — Encounter: Payer: Self-pay | Admitting: Nurse Practitioner

## 2024-02-16 ENCOUNTER — Ambulatory Visit: Payer: Self-pay | Admitting: Nurse Practitioner

## 2024-02-16 VITALS — BP 138/86 | HR 66 | Temp 97.9°F | Ht 73.0 in | Wt 250.8 lb

## 2024-02-16 DIAGNOSIS — J410 Simple chronic bronchitis: Secondary | ICD-10-CM | POA: Diagnosis not present

## 2024-02-16 DIAGNOSIS — Z6833 Body mass index (BMI) 33.0-33.9, adult: Secondary | ICD-10-CM | POA: Diagnosis not present

## 2024-02-16 DIAGNOSIS — M545 Low back pain, unspecified: Secondary | ICD-10-CM | POA: Diagnosis not present

## 2024-02-16 DIAGNOSIS — K219 Gastro-esophageal reflux disease without esophagitis: Secondary | ICD-10-CM | POA: Diagnosis not present

## 2024-02-16 DIAGNOSIS — Z87891 Personal history of nicotine dependence: Secondary | ICD-10-CM | POA: Diagnosis not present

## 2024-02-16 DIAGNOSIS — E785 Hyperlipidemia, unspecified: Secondary | ICD-10-CM

## 2024-02-16 DIAGNOSIS — Z0000 Encounter for general adult medical examination without abnormal findings: Secondary | ICD-10-CM

## 2024-02-16 DIAGNOSIS — G8929 Other chronic pain: Secondary | ICD-10-CM | POA: Diagnosis not present

## 2024-02-16 LAB — CMP14+EGFR
ALT: 17 [IU]/L (ref 0–44)
AST: 18 [IU]/L (ref 0–40)
Albumin: 4 g/dL (ref 3.8–4.9)
Alkaline Phosphatase: 129 [IU]/L — ABNORMAL HIGH (ref 47–123)
BUN/Creatinine Ratio: 14 (ref 9–20)
BUN: 12 mg/dL (ref 6–24)
Bilirubin Total: 0.6 mg/dL (ref 0.0–1.2)
CO2: 20 mmol/L (ref 20–29)
Calcium: 9 mg/dL (ref 8.7–10.2)
Chloride: 104 mmol/L (ref 96–106)
Creatinine, Ser: 0.84 mg/dL (ref 0.76–1.27)
Globulin, Total: 2.5 g/dL (ref 1.5–4.5)
Glucose: 102 mg/dL — ABNORMAL HIGH (ref 70–99)
Potassium: 4.2 mmol/L (ref 3.5–5.2)
Sodium: 139 mmol/L (ref 134–144)
Total Protein: 6.5 g/dL (ref 6.0–8.5)
eGFR: 102 mL/min/{1.73_m2}

## 2024-02-16 LAB — CBC WITH DIFFERENTIAL/PLATELET
Basophils Absolute: 0.1 10*3/uL (ref 0.0–0.2)
Basos: 1 %
EOS (ABSOLUTE): 0.2 10*3/uL (ref 0.0–0.4)
Eos: 3 %
Hematocrit: 49.8 % (ref 37.5–51.0)
Hemoglobin: 16.8 g/dL (ref 13.0–17.7)
Immature Grans (Abs): 0.1 10*3/uL (ref 0.0–0.1)
Immature Granulocytes: 1 %
Lymphocytes Absolute: 2.4 10*3/uL (ref 0.7–3.1)
Lymphs: 29 %
MCH: 33.5 pg — ABNORMAL HIGH (ref 26.6–33.0)
MCHC: 33.7 g/dL (ref 31.5–35.7)
MCV: 99 fL — ABNORMAL HIGH (ref 79–97)
Monocytes Absolute: 0.7 10*3/uL (ref 0.1–0.9)
Monocytes: 8 %
Neutrophils Absolute: 4.7 10*3/uL (ref 1.4–7.0)
Neutrophils: 58 %
Platelets: 228 10*3/uL (ref 150–450)
RBC: 5.02 x10E6/uL (ref 4.14–5.80)
RDW: 12.5 % (ref 11.6–15.4)
WBC: 8.1 10*3/uL (ref 3.4–10.8)

## 2024-02-16 LAB — LIPID PANEL
Chol/HDL Ratio: 4.8 ratio (ref 0.0–5.0)
Cholesterol, Total: 164 mg/dL (ref 100–199)
HDL: 34 mg/dL — ABNORMAL LOW
LDL Chol Calc (NIH): 117 mg/dL — ABNORMAL HIGH (ref 0–99)
Triglycerides: 66 mg/dL (ref 0–149)
VLDL Cholesterol Cal: 13 mg/dL (ref 5–40)

## 2024-02-16 MED ORDER — ATORVASTATIN CALCIUM 40 MG PO TABS: 40.0000 mg | ORAL_TABLET | Freq: Every day | ORAL | 1 refills | Status: AC

## 2024-02-16 MED ORDER — HYDROCODONE-ACETAMINOPHEN 5-325 MG PO TABS: 1.0000 | ORAL_TABLET | Freq: Four times a day (QID) | ORAL | 0 refills | Status: DC | PRN

## 2024-02-16 MED ORDER — BUDESONIDE-FORMOTEROL FUMARATE 80-4.5 MCG/ACT IN AERO: 2.0000 | INHALATION_SPRAY | Freq: Two times a day (BID) | RESPIRATORY_TRACT | 6 refills | Status: AC

## 2024-02-16 MED ORDER — PANTOPRAZOLE SODIUM 40 MG PO TBEC: 40.0000 mg | DELAYED_RELEASE_TABLET | Freq: Every day | ORAL | 1 refills | Status: AC

## 2024-02-16 NOTE — Progress Notes (Signed)
 "  Subjective:    Patient ID: [name] [name], male    DOB: Feb 06, 1965, 59 y.o.   MRN: [id number]   Chief Complaint: annual physical exam    HPI:  [name] [name] is a 60 y.o. who identifies as a male who was assigned male at birth.   Social history: Lives with: wife and kids Work history: works in [employer].   Comes in today for follow up of the following chronic medical issues:  1. Hyperlipidemia with target LDL less than 100 Does not really watch diet and does no dedicated exercise. Lab Results  Component Value Date   CHOL 163 08/18/2023   HDL 34 (L) 08/18/2023   LDLCALC 116 (H) 08/18/2023   TRIG 65 08/18/2023   CHOLHDL 4.8 08/18/2023   The 10-year ASCVD risk score (Arnett DK, et al., 2019) is: 7%   2. Gastroesophageal reflux disease without esophagitis Is on protonix  daily and is doing well.  3. snuff Patient quit  4. BMI 33.0-33.9,adult No recent weight changes  Wt Readings from Last 3 Encounters:  08/18/23 240 lb (108.9 kg)  05/12/23 238 lb (108 kg)  04/28/23 244 lb (110.7 kg)   BMI Readings from Last 3 Encounters:  08/18/23 31.66 kg/m  05/12/23 31.40 kg/m  04/28/23 32.19 kg/m      New complaints: - chronic back pain- has been coming on for years. Pain is stabbing and aching. Does not radiate. Rates pain 7-8/10. Advil helps but has has to take 4 every 6 hours and will only take pain down to 5/10. Has seen Dr. Leeann in  the past  Allergies  Allergen Reactions   Sulfa Antibiotics Rash   Sulfonamide Derivatives Rash   Sertraline Hcl Nausea And Vomiting    Gi upset    Outpatient Encounter Medications as of 02/16/2024  Medication Sig   albuterol  (VENTOLIN  HFA) 108 (90 Base) MCG/ACT inhaler Inhale 2 puffs into the lungs every 6 (six) hours as needed for wheezing or shortness of breath.   amoxicillin -clavulanate (AUGMENTIN ) 875-125 MG tablet Take 1 tablet by mouth 2 (two) times daily.   atorvastatin  (LIPITOR) 40 MG tablet Take 1 tablet (40 mg  total) by mouth at bedtime.   budesonide -formoterol  (SYMBICORT ) 80-4.5 MCG/ACT inhaler Inhale 2 puffs into the lungs 2 (two) times daily.   ciprofloxacin  (CIPRO ) 500 MG tablet Take 1 tablet (500 mg total) by mouth 2 (two) times daily.   fluticasone  (FLONASE ) 50 MCG/ACT nasal spray Place 2 sprays into both nostrils daily.   ibuprofen (ADVIL) 200 MG tablet Take 800-1,200 mg by mouth every 8 (eight) hours as needed (for pain.).   loratadine  (CLARITIN ) 10 MG tablet Take 10 mg by mouth daily.   pantoprazole  (PROTONIX ) 40 MG tablet Take 1 tablet (40 mg total) by mouth at bedtime.   No facility-administered encounter medications on file as of 02/16/2024.    Past Surgical History:  Procedure Laterality Date   CHOLESTEATOMA EXCISION  1994   COLONOSCOPY N/A 11/09/2018   Procedure: COLONOSCOPY;  Surgeon: Shaaron Lamar HERO, MD;  Location: [facility];  Service: Endoscopy;  Laterality: N/A;  12:00   Left ear surgery     Right ear surgery     right knee arthroscopy      Family History  Problem Relation Age of Onset   Colon cancer Neg Hx       Controlled substance contract: n/a     Review of Systems  Constitutional:  Negative for diaphoresis.  Eyes:  Negative for  pain.  Respiratory:  Negative for shortness of breath.   Cardiovascular:  Negative for chest pain, palpitations and leg swelling.  Gastrointestinal:  Negative for abdominal pain.  Endocrine: Negative for polydipsia.  Skin:  Negative for rash.  Neurological:  Negative for dizziness, weakness and headaches.  Hematological:  Does not bruise/bleed easily.  All other systems reviewed and are negative.      Objective:   Physical Exam Vitals and nursing note reviewed.  Constitutional:      Appearance: Normal appearance. He is well-developed.  HENT:     Head: Normocephalic.     Right Ear: Tympanic membrane is scarred and perforated.     Left Ear: Tympanic membrane is scarred.     Ears:     Comments: Right cochlear implant     Nose: Nose normal.     Mouth/Throat:     Mouth: Mucous membranes are moist.     Pharynx: Oropharynx is clear.     Comments: Multiple teeth missing Eyes:     Pupils: Pupils are equal, round, and reactive to light.  Neck:     Thyroid : No thyroid  mass or thyromegaly.     Vascular: No carotid bruit or JVD.     Trachea: Phonation normal.  Cardiovascular:     Rate and Rhythm: Normal rate and regular rhythm.  Pulmonary:     Effort: Pulmonary effort is normal. No respiratory distress.     Breath sounds: Normal breath sounds.  Abdominal:     General: Bowel sounds are normal.     Palpations: Abdomen is soft.     Tenderness: There is no abdominal tenderness.  Musculoskeletal:        General: Normal range of motion.     Cervical back: Normal range of motion and neck supple.  Lymphadenopathy:     Cervical: No cervical adenopathy.  Skin:    General: Skin is warm and dry.  Neurological:     Mental Status: He is alert and oriented to person, place, and time.  Psychiatric:        Behavior: Behavior normal.        Thought Content: Thought content normal.        Judgment: Judgment normal.     BP 138/86   Pulse 66   Temp 97.9 F (36.6 C)   Ht 6' 1 (1.854 m)   Wt 250 lb 12.8 oz (113.8 kg)   BMI 33.09 kg/m          Assessment & Plan:   [name] [name] in today with chief complaint of No chief complaint on file.   1. Annual physical exam (Primary)   2. Hyperlipidemia with target LDL less than 100 Low sodium diet - atorvastatin  (LIPITOR) 40 MG tablet; Take 1 tablet (40 mg total) by mouth at bedtime.  Dispense: 90 tablet; Refill: 1 - CBC with Differential/Platelet - CMP14+EGFR - Lipid panel  3. Gastroesophageal reflux disease without esophagitis Avoid spicy foods Do not eat 2 hours prior to bedtime  - pantoprazole  (PROTONIX ) 40 MG tablet; Take 1 tablet (40 mg total) by mouth at bedtime.  Dispense: 90 tablet; Refill: 1  4. snuff Do not start back  5.  Bronchitis Continue symbicort  as prescribed - Symbicort  as prescribed  6. Chronic back pain Lortab 5/325 1 po BID #30 0 refills Will make appointment for pain management  The above assessment and management plan was discussed with the patient. The patient verbalized understanding of and has agreed to the management plan.  Patient is aware to call the clinic if symptoms persist or worsen. Patient is aware when to return to the clinic for a follow-up visit. Patient educated on when it is appropriate to go to the emergency department.   Mary-Margaret Gladis, FNP   "

## 2024-02-19 ENCOUNTER — Ambulatory Visit: Payer: Self-pay | Admitting: Nurse Practitioner

## 2024-02-23 ENCOUNTER — Ambulatory Visit: Admitting: Nurse Practitioner

## 2024-02-23 ENCOUNTER — Encounter: Payer: Self-pay | Admitting: Nurse Practitioner

## 2024-02-23 VITALS — BP 134/80 | HR 77 | Ht 73.0 in | Wt 252.0 lb

## 2024-02-23 DIAGNOSIS — G8929 Other chronic pain: Secondary | ICD-10-CM

## 2024-02-23 DIAGNOSIS — Z79899 Other long term (current) drug therapy: Secondary | ICD-10-CM

## 2024-02-23 DIAGNOSIS — M545 Low back pain, unspecified: Secondary | ICD-10-CM

## 2024-02-23 MED ORDER — HYDROCODONE-ACETAMINOPHEN 5-325 MG PO TABS: 1.0000 | ORAL_TABLET | Freq: Two times a day (BID) | ORAL | 0 refills | Status: AC

## 2024-02-23 NOTE — Progress Notes (Signed)
 "  Subjective:    Patient ID: [name] [name], male    DOB: 08/28/1965, 59 y.o.   MRN: [id number]   Chief Complaint: new pain management  Back Pain    [place]  Controlled Substance Abuse database reviewed- Yes If yes- were their any concerning findings : none    02/23/2024    8:08 AM 08/18/2023    9:53 AM 05/12/2023    9:54 AM  Depression screen PHQ 2/9  Decreased Interest 0 0 0  Down, Depressed, Hopeless 0 0 0  PHQ - 2 Score 0 0 0         08/18/2023    9:53 AM 02/16/2023    8:05 AM 12/08/2022    3:00 PM 08/12/2022    8:00 AM  GAD 7 : Generalized Anxiety Score  Nervous, Anxious, on Edge 0  0  0  0   Control/stop worrying 0  0  0  0   Worry too much - different things 0  0  0  0   Trouble relaxing 0  0  0  0   Restless 0  0  0  0   Easily annoyed or irritable 0  0  0  0   Afraid - awful might happen 0  0  0  0   Total GAD 7 Score 0 0 0 0  Anxiety Difficulty Not difficult at all Not difficult at all Not difficult at all Not difficult at all     Data saved with a previous flowsheet row definition     [employer] drug screen performed- Yes  SOAPP  0= never  1= seldom  2=sometimes  3= often  4= very often  How often do you have mood swings? 0 How often do you smoke a cigarette within an hour after waking up? 4 How often have you taken medication other than the way that it was prescribed?0 How often have you used illegal drugs in the past 5 years? 0 How often, in your lifetime, have you had legal problems or been arrested? 0  Score 4  Alcohol Audit - How often during the last year have found that you: 0-Never   1- Less than monthly   2- Monthly     3-Weekly     4-daily or almost daily  - found that you were not able to stop drinking once you started- 0 -failed to do what was normally expected of you because of drinking- 0 -needed a first drink in the morning- 0 -had a feeling of guilt or remorse after drinking- 0 -are/were unable to remember what happened  the night before because of your drinking- 0  0- NO   2- yes but not in last year  4- yes during last year -Have you or someone else been injured because of your drinking- 0 - Has anyone been concerned about your drinking or suggested you cut down- 0        TOTAL- 0  ( 0-7- alcohol education, 8-15- simple advice, 16-19 simple advice plus counseling, 20-40 referral for evaluation and treatment )      No data to display             Designated Pharmacy- [facility] [place]  Pain assessment: 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 Pain location- lower back and neck Pain on scale of 1-10- 7-8/10 today Frequency- daily What increases pain-walking long distances and heavy lifting What makes pain Better-rest helps Effects on ADL -  none  Prior treatments tried and failed- has had multiple steroid injection Current opioids rx- hydrocodone  # prescribed- #60 Morphine mg equivalent-  Pain management agreement reviewed and signed- Yes  Patient Active Problem List   Diagnosis Date Noted   Simple chronic bronchitis (HCC) 08/18/2023   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       Objective:   Physical Exam Constitutional:      Appearance: Normal appearance. He is obese.  Cardiovascular:     Rate and Rhythm: Normal rate and regular rhythm.     Heart sounds: Normal heart sounds.  Pulmonary:     Breath sounds: Normal breath sounds.  Musculoskeletal:     Comments: FROM of lumbar spine with pain on flexion and extension (-) SLR bil  Skin:    General: Skin is warm.  Neurological:     General: No focal deficit  present.     Mental Status: He is alert and oriented to person, place, and time.  Psychiatric:        Mood and Affect: Mood normal.        Behavior: Behavior normal.    BP 134/80   Pulse 77   Ht 6' 1 (1.854 m)   Wt 252 lb (114.3 kg)   SpO2 97%   BMI 33.25 kg/m         Assessment & Plan:   [name] in today with chief complaint of Back Pain   1. Controlled substance agreement signed (Primary) - [employer] Select 13 (MW), Urine  2. Chronic midline low back pain without sciatica Moist heat to back Daily back exercises - HYDROcodone -acetaminophen  (NORCO/VICODIN) 5-325 MG tablet; Take 1 tablet by mouth 2 (two) times daily.  Dispense: 60 tablet; Refill: 0 - HYDROcodone -acetaminophen  (NORCO/VICODIN) 5-325 MG tablet; Take 1 tablet by mouth 2 (two) times daily.  Dispense: 60 tablet; Refill: 0 - HYDROcodone -acetaminophen  (NORCO/VICODIN) 5-325 MG tablet; Take 1 tablet by mouth 2 (two) times daily.  Dispense: 60 tablet; Refill: 0    The above assessment and management plan was discussed with the patient. The patient verbalized understanding of and has agreed to the management plan. Patient is aware to call the clinic if symptoms persist or worsen. Patient is aware when to return to the clinic for a follow-up visit. Patient educated on when it is appropriate to go to the emergency department.   Mary-Margaret Gladis, FNP   "

## 2024-02-23 NOTE — Patient Instructions (Signed)
 How to Manage Opioid Pain Medicine Opioid pain medicines are strong medicines that are used to treat very bad pain. When you take opioids for a short time, they can help you: Sleep better. Do better in physical therapy. Feel better after an injury. Recover from surgery. Only take these medicines if your health care provider writes a prescription for you. These medicines are meant to be taken for a short time because opioids can be very addictive. The longer you take opioids, the harder it is to stop taking them. What is a pain treatment plan? A pain treatment plan is a plan made by you and your provider. A good treatment plan uses other types of treatments, not just medicines. This lowers the risk of side effects. To help you make a good plan: Talk about the goals of your treatment, including: How much pain you might expect to have. How you'll manage the pain. Talk about the risks and benefits of taking opioid medicines. Tell your provider about the amount of medicine you take. Tell your provider if you take drugs and drink alcohol. Get your prescriptions from only one provider. Pain can be managed with treatments other than medicines. Talk with your provider about other ways to help your pain, such as: Physical therapy, or gentle exercises. Counseling. Eating healthy foods. Biofeedback. Massage. Meditation. Pain medicines that aren't opioids. What are the risks of taking opioids? Opioid medicines have many side effects, such as: Trouble pooping (constipation). Throwing up or feeling like you may throw up. Trouble breathing. Feeling very sleepy. Confusion. Addiction, also called opioid use disorder. Itching. Taking opioids for a long time can affect your ability to do daily tasks. It also puts you at risk for: Car accidents. Depression. Suicide. Heart attack. Overdose. This is taking too much of an opioid. It can lead to death. How to use opioid pain medicine Taking your  medicine Take your pain medicine exactly as told. Take it only when you need it. If your pain is not too bad, you may take less medicine. If you have no pain, do not take the medicine unless your provider tells you to take it. If your pain is very bad, do not take more medicine than your provider told you to take. Ask your provider what to do. To help you prevent an overdose: Write down the time when you take your medicine. Look at the time before you take your next dose. Take other medicines only as told. Keeping yourself and others safe While taking opioids: Do not drive, use machines, or power tools. Do not sign important papers. Do not drink alcohol. Do not take sleeping pills. Do not take care of children by yourself. Do not do activities where you need to climb or be in high places, like working on a ladder. Be careful if you're spending time around water. Do not share your pain medicine with anyone. Keep your opioids locked up or in a place where children and pets can't reach them. Stopping your opioids If you have been taking opioids for more than a few weeks, work with your provider when it's time to stop. Your provider will tell you how to take less and less (taper) until you stop taking the medicine completely. Tapering down the dose lowers the risk of withdrawal symptoms. These symptoms may include: Pain and cramping in your belly. Feeling like throwing up. Sweating. Feeling very sleepy. Feeling restless. Having uncontrollable shaking (tremors). Craving for the opioid. Getting rid of unused pills Do not  save any leftover pills. To get rid of them, you can: Take the medicine to a prescription take-back program. This is usually offered by the county or law enforcement. Bring the medicine to a pharmacy that takes unused pills. Check the label or package insert of your medicine to see whether it's safe to flush or throw out your medicine. To throw out your medicine: Remove  the medicine from the original container. Put it into a sealable bag or container. Mix it with used coffee grounds, food scraps, dirt, or cat litter. Put it in the trash. Follow these instructions at home: Activity Exercise as told. Avoid activities that make your pain worse. Ask what things are safe for you to do at home. Ask when you can go back to work or school. General instructions  You may need to take steps to help treat or prevent trouble pooping (constipation), such as: Taking medicines to help you poop. Eating foods high in fiber, like beans, whole grains, and fresh fruits and vegetables. Drinking more fluids as told. Keep all follow-up visits. Your pain treatment plan may change over time. Where to find more information To learn more, go to: Centers for Disease Control (CDC): footballexhibition.com.br Type opioid therapy in the search box. Look for overdose prevention in search results. U.S. Food and Drug Administration (FDA): pumpkinsearch.com.ee Type how to dispose of unused medicine in the search box. Find the links you need. Get help right away if: You have symptoms, such as: Confusion. Fainting, or feeling like you'll faint. Cold, clammy skin. Blue lips or fingernails. You feel like you have taken too much pain medicine (overdose). Symptoms of overdose include: Slow and shallow breathing. Very slow heartbeat. Slurred speech. Trouble waking from sleep. Throwing up. Very small pupils. You feel like you may hurt yourself or others. You have thoughts about taking your own life. You have other thoughts or feelings that worry you. These symptoms may be an emergency. Take one of these steps right away: Go to your nearest emergency room. Call 911. Call the [facility] ([phone]). Contact the Suicide Crisis Lifeline (24/7, free and confidential): Call or text 988. Chat online at chat.newsactor.se. For Veterans and their loved ones: Call 988 and press  1. Text the [employer] at 5151804496. Chat online at reservationslist.si. This information is not intended to replace advice given to you by your health care provider. Make sure you discuss any questions you have with your health care provider. Document Revised: 05/22/2023 Document Reviewed: 04/07/2023 Elsevier Patient Education  2025 [employer].

## 2024-05-21 ENCOUNTER — Ambulatory Visit: Admitting: Nurse Practitioner
